# Patient Record
Sex: Female | Born: 1935 | Race: White | Hispanic: No | Smoking: Former smoker
Health system: Southern US, Community
[De-identification: ages and names within clinical notes are randomized; demographics above are authoritative.]

## PROBLEM LIST (undated history)

## (undated) DIAGNOSIS — I482 Chronic atrial fibrillation, unspecified: Secondary | ICD-10-CM

## (undated) DIAGNOSIS — N184 Chronic kidney disease, stage 4 (severe): Secondary | ICD-10-CM

## (undated) DIAGNOSIS — I130 Hypertensive heart and chronic kidney disease with heart failure and stage 1 through stage 4 chronic kidney disease, or unspecified chronic kidney disease: Secondary | ICD-10-CM

## (undated) DIAGNOSIS — Z7901 Long term (current) use of anticoagulants: Secondary | ICD-10-CM

## (undated) DIAGNOSIS — I5032 Chronic diastolic (congestive) heart failure: Secondary | ICD-10-CM

## (undated) HISTORY — DX: Chronic kidney disease, stage 4 (severe): N18.4

## (undated) HISTORY — DX: Hypertensive heart and chronic kidney disease with heart failure and stage 1 through stage 4 chronic kidney disease, or unspecified chronic kidney disease: I13.0

## (undated) HISTORY — DX: Chronic atrial fibrillation, unspecified: I48.20

## (undated) HISTORY — PX: RENAL ARTERY STENT: SHX2321

## (undated) HISTORY — DX: Chronic diastolic (congestive) heart failure: I50.32

## (undated) HISTORY — DX: Long term (current) use of anticoagulants: Z79.01

---

## 2002-05-28 ENCOUNTER — Ambulatory Visit (HOSPITAL_COMMUNITY): Admission: RE | Admit: 2002-05-28 | Discharge: 2002-05-28 | Payer: Self-pay | Admitting: Emergency Medicine

## 2011-07-04 DIAGNOSIS — I5030 Unspecified diastolic (congestive) heart failure: Secondary | ICD-10-CM | POA: Diagnosis not present

## 2011-07-04 DIAGNOSIS — I150 Renovascular hypertension: Secondary | ICD-10-CM | POA: Diagnosis not present

## 2011-07-04 DIAGNOSIS — I4891 Unspecified atrial fibrillation: Secondary | ICD-10-CM | POA: Diagnosis not present

## 2011-07-04 DIAGNOSIS — I10 Essential (primary) hypertension: Secondary | ICD-10-CM | POA: Diagnosis not present

## 2011-07-17 DIAGNOSIS — I10 Essential (primary) hypertension: Secondary | ICD-10-CM | POA: Diagnosis not present

## 2011-07-17 DIAGNOSIS — I4891 Unspecified atrial fibrillation: Secondary | ICD-10-CM | POA: Diagnosis not present

## 2011-07-17 DIAGNOSIS — I509 Heart failure, unspecified: Secondary | ICD-10-CM | POA: Diagnosis not present

## 2011-07-24 DIAGNOSIS — I701 Atherosclerosis of renal artery: Secondary | ICD-10-CM | POA: Diagnosis not present

## 2011-08-06 DIAGNOSIS — I509 Heart failure, unspecified: Secondary | ICD-10-CM | POA: Diagnosis not present

## 2011-08-06 DIAGNOSIS — I4891 Unspecified atrial fibrillation: Secondary | ICD-10-CM | POA: Diagnosis not present

## 2011-08-06 DIAGNOSIS — I10 Essential (primary) hypertension: Secondary | ICD-10-CM | POA: Diagnosis not present

## 2011-08-06 DIAGNOSIS — I5030 Unspecified diastolic (congestive) heart failure: Secondary | ICD-10-CM | POA: Diagnosis not present

## 2011-08-17 DIAGNOSIS — I4891 Unspecified atrial fibrillation: Secondary | ICD-10-CM | POA: Diagnosis not present

## 2011-09-19 DIAGNOSIS — I4891 Unspecified atrial fibrillation: Secondary | ICD-10-CM | POA: Diagnosis not present

## 2011-09-19 DIAGNOSIS — J209 Acute bronchitis, unspecified: Secondary | ICD-10-CM | POA: Diagnosis not present

## 2011-09-19 DIAGNOSIS — K219 Gastro-esophageal reflux disease without esophagitis: Secondary | ICD-10-CM | POA: Diagnosis not present

## 2011-09-21 DIAGNOSIS — I4891 Unspecified atrial fibrillation: Secondary | ICD-10-CM | POA: Diagnosis not present

## 2011-09-24 DIAGNOSIS — I4891 Unspecified atrial fibrillation: Secondary | ICD-10-CM | POA: Diagnosis not present

## 2011-10-25 DIAGNOSIS — I4891 Unspecified atrial fibrillation: Secondary | ICD-10-CM | POA: Diagnosis not present

## 2011-11-27 DIAGNOSIS — I4891 Unspecified atrial fibrillation: Secondary | ICD-10-CM | POA: Diagnosis not present

## 2011-12-04 DIAGNOSIS — E789 Disorder of lipoprotein metabolism, unspecified: Secondary | ICD-10-CM | POA: Diagnosis not present

## 2011-12-04 DIAGNOSIS — I4891 Unspecified atrial fibrillation: Secondary | ICD-10-CM | POA: Diagnosis not present

## 2011-12-04 DIAGNOSIS — E785 Hyperlipidemia, unspecified: Secondary | ICD-10-CM | POA: Diagnosis not present

## 2011-12-04 DIAGNOSIS — I10 Essential (primary) hypertension: Secondary | ICD-10-CM | POA: Diagnosis not present

## 2011-12-10 DIAGNOSIS — Z87891 Personal history of nicotine dependence: Secondary | ICD-10-CM | POA: Diagnosis not present

## 2011-12-10 DIAGNOSIS — J449 Chronic obstructive pulmonary disease, unspecified: Secondary | ICD-10-CM | POA: Diagnosis not present

## 2011-12-10 DIAGNOSIS — J310 Chronic rhinitis: Secondary | ICD-10-CM | POA: Diagnosis not present

## 2011-12-10 DIAGNOSIS — G4761 Periodic limb movement disorder: Secondary | ICD-10-CM | POA: Diagnosis not present

## 2012-01-14 DIAGNOSIS — R404 Transient alteration of awareness: Secondary | ICD-10-CM | POA: Diagnosis not present

## 2012-01-14 DIAGNOSIS — G4761 Periodic limb movement disorder: Secondary | ICD-10-CM | POA: Diagnosis not present

## 2012-01-14 DIAGNOSIS — J449 Chronic obstructive pulmonary disease, unspecified: Secondary | ICD-10-CM | POA: Diagnosis not present

## 2012-01-14 DIAGNOSIS — J310 Chronic rhinitis: Secondary | ICD-10-CM | POA: Diagnosis not present

## 2012-01-21 DIAGNOSIS — Z87891 Personal history of nicotine dependence: Secondary | ICD-10-CM | POA: Diagnosis not present

## 2012-02-01 DIAGNOSIS — N189 Chronic kidney disease, unspecified: Secondary | ICD-10-CM | POA: Diagnosis not present

## 2012-02-01 DIAGNOSIS — R269 Unspecified abnormalities of gait and mobility: Secondary | ICD-10-CM | POA: Diagnosis not present

## 2012-02-01 DIAGNOSIS — I10 Essential (primary) hypertension: Secondary | ICD-10-CM | POA: Diagnosis not present

## 2012-02-01 DIAGNOSIS — I4891 Unspecified atrial fibrillation: Secondary | ICD-10-CM | POA: Diagnosis not present

## 2012-02-01 DIAGNOSIS — R079 Chest pain, unspecified: Secondary | ICD-10-CM | POA: Diagnosis not present

## 2012-02-12 DIAGNOSIS — R935 Abnormal findings on diagnostic imaging of other abdominal regions, including retroperitoneum: Secondary | ICD-10-CM | POA: Diagnosis not present

## 2012-02-20 DIAGNOSIS — R079 Chest pain, unspecified: Secondary | ICD-10-CM | POA: Diagnosis not present

## 2012-02-20 DIAGNOSIS — I2510 Atherosclerotic heart disease of native coronary artery without angina pectoris: Secondary | ICD-10-CM | POA: Diagnosis not present

## 2012-03-03 DIAGNOSIS — I4891 Unspecified atrial fibrillation: Secondary | ICD-10-CM | POA: Diagnosis not present

## 2012-03-10 DIAGNOSIS — I4891 Unspecified atrial fibrillation: Secondary | ICD-10-CM | POA: Diagnosis not present

## 2012-03-14 DIAGNOSIS — Z23 Encounter for immunization: Secondary | ICD-10-CM | POA: Diagnosis not present

## 2012-04-03 DIAGNOSIS — I4891 Unspecified atrial fibrillation: Secondary | ICD-10-CM | POA: Diagnosis not present

## 2012-04-03 DIAGNOSIS — G500 Trigeminal neuralgia: Secondary | ICD-10-CM | POA: Diagnosis not present

## 2012-04-03 DIAGNOSIS — I2510 Atherosclerotic heart disease of native coronary artery without angina pectoris: Secondary | ICD-10-CM | POA: Diagnosis not present

## 2012-04-03 DIAGNOSIS — I10 Essential (primary) hypertension: Secondary | ICD-10-CM | POA: Diagnosis not present

## 2012-04-07 DIAGNOSIS — R42 Dizziness and giddiness: Secondary | ICD-10-CM | POA: Diagnosis not present

## 2012-04-07 DIAGNOSIS — R079 Chest pain, unspecified: Secondary | ICD-10-CM | POA: Diagnosis not present

## 2012-04-07 DIAGNOSIS — K219 Gastro-esophageal reflux disease without esophagitis: Secondary | ICD-10-CM | POA: Diagnosis not present

## 2012-04-07 DIAGNOSIS — R4182 Altered mental status, unspecified: Secondary | ICD-10-CM | POA: Diagnosis not present

## 2012-04-07 DIAGNOSIS — E7800 Pure hypercholesterolemia, unspecified: Secondary | ICD-10-CM | POA: Diagnosis not present

## 2012-04-07 DIAGNOSIS — T50995A Adverse effect of other drugs, medicaments and biological substances, initial encounter: Secondary | ICD-10-CM | POA: Diagnosis not present

## 2012-04-07 DIAGNOSIS — J438 Other emphysema: Secondary | ICD-10-CM | POA: Diagnosis not present

## 2012-04-07 DIAGNOSIS — I10 Essential (primary) hypertension: Secondary | ICD-10-CM | POA: Diagnosis not present

## 2012-04-07 DIAGNOSIS — I4891 Unspecified atrial fibrillation: Secondary | ICD-10-CM | POA: Diagnosis not present

## 2012-04-07 DIAGNOSIS — I509 Heart failure, unspecified: Secondary | ICD-10-CM | POA: Diagnosis not present

## 2012-04-07 DIAGNOSIS — T887XXA Unspecified adverse effect of drug or medicament, initial encounter: Secondary | ICD-10-CM | POA: Diagnosis not present

## 2012-04-08 DIAGNOSIS — J310 Chronic rhinitis: Secondary | ICD-10-CM | POA: Diagnosis not present

## 2012-04-08 DIAGNOSIS — I10 Essential (primary) hypertension: Secondary | ICD-10-CM | POA: Diagnosis not present

## 2012-04-08 DIAGNOSIS — G4761 Periodic limb movement disorder: Secondary | ICD-10-CM | POA: Diagnosis not present

## 2012-04-08 DIAGNOSIS — J449 Chronic obstructive pulmonary disease, unspecified: Secondary | ICD-10-CM | POA: Diagnosis not present

## 2012-04-08 DIAGNOSIS — R079 Chest pain, unspecified: Secondary | ICD-10-CM | POA: Diagnosis not present

## 2012-04-08 DIAGNOSIS — I4891 Unspecified atrial fibrillation: Secondary | ICD-10-CM | POA: Diagnosis not present

## 2012-04-08 DIAGNOSIS — E7800 Pure hypercholesterolemia, unspecified: Secondary | ICD-10-CM | POA: Diagnosis not present

## 2012-04-08 DIAGNOSIS — R4182 Altered mental status, unspecified: Secondary | ICD-10-CM | POA: Diagnosis not present

## 2012-04-08 DIAGNOSIS — R404 Transient alteration of awareness: Secondary | ICD-10-CM | POA: Diagnosis not present

## 2012-04-08 DIAGNOSIS — R42 Dizziness and giddiness: Secondary | ICD-10-CM | POA: Diagnosis not present

## 2012-04-08 DIAGNOSIS — I509 Heart failure, unspecified: Secondary | ICD-10-CM | POA: Diagnosis not present

## 2012-04-08 DIAGNOSIS — K219 Gastro-esophageal reflux disease without esophagitis: Secondary | ICD-10-CM | POA: Diagnosis not present

## 2012-04-09 DIAGNOSIS — I509 Heart failure, unspecified: Secondary | ICD-10-CM | POA: Diagnosis not present

## 2012-04-09 DIAGNOSIS — I2510 Atherosclerotic heart disease of native coronary artery without angina pectoris: Secondary | ICD-10-CM | POA: Diagnosis not present

## 2012-04-09 DIAGNOSIS — I4891 Unspecified atrial fibrillation: Secondary | ICD-10-CM | POA: Diagnosis not present

## 2012-04-09 DIAGNOSIS — N189 Chronic kidney disease, unspecified: Secondary | ICD-10-CM | POA: Diagnosis not present

## 2012-04-23 DIAGNOSIS — E876 Hypokalemia: Secondary | ICD-10-CM | POA: Diagnosis not present

## 2012-04-23 DIAGNOSIS — I5030 Unspecified diastolic (congestive) heart failure: Secondary | ICD-10-CM | POA: Diagnosis not present

## 2012-04-23 DIAGNOSIS — I4891 Unspecified atrial fibrillation: Secondary | ICD-10-CM | POA: Diagnosis not present

## 2012-04-23 DIAGNOSIS — I10 Essential (primary) hypertension: Secondary | ICD-10-CM | POA: Diagnosis not present

## 2012-04-23 DIAGNOSIS — I6529 Occlusion and stenosis of unspecified carotid artery: Secondary | ICD-10-CM | POA: Diagnosis not present

## 2012-05-05 DIAGNOSIS — I4891 Unspecified atrial fibrillation: Secondary | ICD-10-CM | POA: Diagnosis not present

## 2012-05-19 DIAGNOSIS — I4891 Unspecified atrial fibrillation: Secondary | ICD-10-CM | POA: Diagnosis not present

## 2012-06-03 DIAGNOSIS — J029 Acute pharyngitis, unspecified: Secondary | ICD-10-CM | POA: Diagnosis not present

## 2012-06-03 DIAGNOSIS — K219 Gastro-esophageal reflux disease without esophagitis: Secondary | ICD-10-CM | POA: Diagnosis not present

## 2012-06-10 DIAGNOSIS — J438 Other emphysema: Secondary | ICD-10-CM | POA: Diagnosis not present

## 2012-06-10 DIAGNOSIS — I10 Essential (primary) hypertension: Secondary | ICD-10-CM | POA: Diagnosis not present

## 2012-06-10 DIAGNOSIS — J9620 Acute and chronic respiratory failure, unspecified whether with hypoxia or hypercapnia: Secondary | ICD-10-CM | POA: Diagnosis not present

## 2012-06-10 DIAGNOSIS — R079 Chest pain, unspecified: Secondary | ICD-10-CM | POA: Diagnosis not present

## 2012-06-10 DIAGNOSIS — M810 Age-related osteoporosis without current pathological fracture: Secondary | ICD-10-CM | POA: Diagnosis present

## 2012-06-10 DIAGNOSIS — I509 Heart failure, unspecified: Secondary | ICD-10-CM | POA: Diagnosis not present

## 2012-06-10 DIAGNOSIS — Z7901 Long term (current) use of anticoagulants: Secondary | ICD-10-CM | POA: Diagnosis not present

## 2012-06-10 DIAGNOSIS — J9610 Chronic respiratory failure, unspecified whether with hypoxia or hypercapnia: Secondary | ICD-10-CM | POA: Diagnosis not present

## 2012-06-10 DIAGNOSIS — R0902 Hypoxemia: Secondary | ICD-10-CM | POA: Diagnosis not present

## 2012-06-10 DIAGNOSIS — J9600 Acute respiratory failure, unspecified whether with hypoxia or hypercapnia: Secondary | ICD-10-CM | POA: Diagnosis not present

## 2012-06-10 DIAGNOSIS — I252 Old myocardial infarction: Secondary | ICD-10-CM | POA: Diagnosis not present

## 2012-06-10 DIAGNOSIS — K219 Gastro-esophageal reflux disease without esophagitis: Secondary | ICD-10-CM | POA: Diagnosis present

## 2012-06-10 DIAGNOSIS — E785 Hyperlipidemia, unspecified: Secondary | ICD-10-CM | POA: Diagnosis present

## 2012-06-10 DIAGNOSIS — I701 Atherosclerosis of renal artery: Secondary | ICD-10-CM | POA: Diagnosis present

## 2012-06-10 DIAGNOSIS — J4489 Other specified chronic obstructive pulmonary disease: Secondary | ICD-10-CM | POA: Diagnosis not present

## 2012-06-10 DIAGNOSIS — J441 Chronic obstructive pulmonary disease with (acute) exacerbation: Secondary | ICD-10-CM | POA: Diagnosis not present

## 2012-06-10 DIAGNOSIS — I6529 Occlusion and stenosis of unspecified carotid artery: Secondary | ICD-10-CM | POA: Diagnosis present

## 2012-06-10 DIAGNOSIS — R1310 Dysphagia, unspecified: Secondary | ICD-10-CM | POA: Diagnosis not present

## 2012-06-10 DIAGNOSIS — J189 Pneumonia, unspecified organism: Secondary | ICD-10-CM | POA: Diagnosis not present

## 2012-06-10 DIAGNOSIS — Z79899 Other long term (current) drug therapy: Secondary | ICD-10-CM | POA: Diagnosis not present

## 2012-06-10 DIAGNOSIS — I5030 Unspecified diastolic (congestive) heart failure: Secondary | ICD-10-CM | POA: Diagnosis not present

## 2012-06-10 DIAGNOSIS — Z9981 Dependence on supplemental oxygen: Secondary | ICD-10-CM | POA: Diagnosis not present

## 2012-06-10 DIAGNOSIS — Z87891 Personal history of nicotine dependence: Secondary | ICD-10-CM | POA: Diagnosis not present

## 2012-06-10 DIAGNOSIS — Z9861 Coronary angioplasty status: Secondary | ICD-10-CM | POA: Diagnosis not present

## 2012-06-10 DIAGNOSIS — I5033 Acute on chronic diastolic (congestive) heart failure: Secondary | ICD-10-CM | POA: Diagnosis not present

## 2012-06-10 DIAGNOSIS — I501 Left ventricular failure: Secondary | ICD-10-CM | POA: Diagnosis not present

## 2012-06-10 DIAGNOSIS — R0602 Shortness of breath: Secondary | ICD-10-CM | POA: Diagnosis not present

## 2012-06-10 DIAGNOSIS — I5021 Acute systolic (congestive) heart failure: Secondary | ICD-10-CM | POA: Diagnosis not present

## 2012-06-10 DIAGNOSIS — I369 Nonrheumatic tricuspid valve disorder, unspecified: Secondary | ICD-10-CM | POA: Diagnosis not present

## 2012-06-10 DIAGNOSIS — I4891 Unspecified atrial fibrillation: Secondary | ICD-10-CM | POA: Diagnosis not present

## 2012-06-10 DIAGNOSIS — J449 Chronic obstructive pulmonary disease, unspecified: Secondary | ICD-10-CM | POA: Diagnosis not present

## 2012-06-10 DIAGNOSIS — I129 Hypertensive chronic kidney disease with stage 1 through stage 4 chronic kidney disease, or unspecified chronic kidney disease: Secondary | ICD-10-CM | POA: Diagnosis present

## 2012-06-16 DIAGNOSIS — J9620 Acute and chronic respiratory failure, unspecified whether with hypoxia or hypercapnia: Secondary | ICD-10-CM | POA: Diagnosis not present

## 2012-06-16 DIAGNOSIS — J189 Pneumonia, unspecified organism: Secondary | ICD-10-CM | POA: Diagnosis not present

## 2012-06-16 DIAGNOSIS — I509 Heart failure, unspecified: Secondary | ICD-10-CM | POA: Diagnosis not present

## 2012-06-16 DIAGNOSIS — J9600 Acute respiratory failure, unspecified whether with hypoxia or hypercapnia: Secondary | ICD-10-CM | POA: Diagnosis not present

## 2012-06-16 DIAGNOSIS — J449 Chronic obstructive pulmonary disease, unspecified: Secondary | ICD-10-CM | POA: Diagnosis not present

## 2012-06-16 DIAGNOSIS — M810 Age-related osteoporosis without current pathological fracture: Secondary | ICD-10-CM | POA: Diagnosis not present

## 2012-06-16 DIAGNOSIS — J9610 Chronic respiratory failure, unspecified whether with hypoxia or hypercapnia: Secondary | ICD-10-CM | POA: Diagnosis not present

## 2012-06-16 DIAGNOSIS — J438 Other emphysema: Secondary | ICD-10-CM | POA: Diagnosis not present

## 2012-06-16 DIAGNOSIS — I4891 Unspecified atrial fibrillation: Secondary | ICD-10-CM | POA: Diagnosis not present

## 2012-06-16 DIAGNOSIS — E785 Hyperlipidemia, unspecified: Secondary | ICD-10-CM | POA: Diagnosis not present

## 2012-06-16 DIAGNOSIS — R509 Fever, unspecified: Secondary | ICD-10-CM | POA: Diagnosis not present

## 2012-06-16 DIAGNOSIS — I10 Essential (primary) hypertension: Secondary | ICD-10-CM | POA: Diagnosis not present

## 2012-06-17 DIAGNOSIS — J189 Pneumonia, unspecified organism: Secondary | ICD-10-CM | POA: Diagnosis not present

## 2012-06-17 DIAGNOSIS — I10 Essential (primary) hypertension: Secondary | ICD-10-CM | POA: Diagnosis not present

## 2012-06-17 DIAGNOSIS — I4891 Unspecified atrial fibrillation: Secondary | ICD-10-CM | POA: Diagnosis not present

## 2012-06-17 DIAGNOSIS — J9610 Chronic respiratory failure, unspecified whether with hypoxia or hypercapnia: Secondary | ICD-10-CM | POA: Diagnosis not present

## 2012-07-18 DIAGNOSIS — M6281 Muscle weakness (generalized): Secondary | ICD-10-CM | POA: Diagnosis not present

## 2012-07-18 DIAGNOSIS — J441 Chronic obstructive pulmonary disease with (acute) exacerbation: Secondary | ICD-10-CM | POA: Diagnosis not present

## 2012-07-18 DIAGNOSIS — R269 Unspecified abnormalities of gait and mobility: Secondary | ICD-10-CM | POA: Diagnosis not present

## 2012-07-18 DIAGNOSIS — Z5189 Encounter for other specified aftercare: Secondary | ICD-10-CM | POA: Diagnosis not present

## 2012-07-18 DIAGNOSIS — I509 Heart failure, unspecified: Secondary | ICD-10-CM | POA: Diagnosis not present

## 2012-07-18 DIAGNOSIS — I10 Essential (primary) hypertension: Secondary | ICD-10-CM | POA: Diagnosis not present

## 2012-07-21 DIAGNOSIS — R269 Unspecified abnormalities of gait and mobility: Secondary | ICD-10-CM | POA: Diagnosis not present

## 2012-07-21 DIAGNOSIS — M6281 Muscle weakness (generalized): Secondary | ICD-10-CM | POA: Diagnosis not present

## 2012-07-21 DIAGNOSIS — I10 Essential (primary) hypertension: Secondary | ICD-10-CM | POA: Diagnosis not present

## 2012-07-21 DIAGNOSIS — I509 Heart failure, unspecified: Secondary | ICD-10-CM | POA: Diagnosis not present

## 2012-07-21 DIAGNOSIS — J441 Chronic obstructive pulmonary disease with (acute) exacerbation: Secondary | ICD-10-CM | POA: Diagnosis not present

## 2012-07-21 DIAGNOSIS — Z5189 Encounter for other specified aftercare: Secondary | ICD-10-CM | POA: Diagnosis not present

## 2012-07-25 DIAGNOSIS — R269 Unspecified abnormalities of gait and mobility: Secondary | ICD-10-CM | POA: Diagnosis not present

## 2012-07-25 DIAGNOSIS — I10 Essential (primary) hypertension: Secondary | ICD-10-CM | POA: Diagnosis not present

## 2012-07-25 DIAGNOSIS — M6281 Muscle weakness (generalized): Secondary | ICD-10-CM | POA: Diagnosis not present

## 2012-07-25 DIAGNOSIS — I509 Heart failure, unspecified: Secondary | ICD-10-CM | POA: Diagnosis not present

## 2012-07-25 DIAGNOSIS — J441 Chronic obstructive pulmonary disease with (acute) exacerbation: Secondary | ICD-10-CM | POA: Diagnosis not present

## 2012-07-25 DIAGNOSIS — Z5189 Encounter for other specified aftercare: Secondary | ICD-10-CM | POA: Diagnosis not present

## 2012-07-28 DIAGNOSIS — R269 Unspecified abnormalities of gait and mobility: Secondary | ICD-10-CM | POA: Diagnosis not present

## 2012-07-28 DIAGNOSIS — Z5189 Encounter for other specified aftercare: Secondary | ICD-10-CM | POA: Diagnosis not present

## 2012-07-28 DIAGNOSIS — M6281 Muscle weakness (generalized): Secondary | ICD-10-CM | POA: Diagnosis not present

## 2012-07-28 DIAGNOSIS — I509 Heart failure, unspecified: Secondary | ICD-10-CM | POA: Diagnosis not present

## 2012-07-28 DIAGNOSIS — I10 Essential (primary) hypertension: Secondary | ICD-10-CM | POA: Diagnosis not present

## 2012-07-28 DIAGNOSIS — J441 Chronic obstructive pulmonary disease with (acute) exacerbation: Secondary | ICD-10-CM | POA: Diagnosis not present

## 2012-07-29 DIAGNOSIS — I4891 Unspecified atrial fibrillation: Secondary | ICD-10-CM | POA: Diagnosis not present

## 2012-07-29 DIAGNOSIS — J4489 Other specified chronic obstructive pulmonary disease: Secondary | ICD-10-CM | POA: Diagnosis not present

## 2012-07-29 DIAGNOSIS — J449 Chronic obstructive pulmonary disease, unspecified: Secondary | ICD-10-CM | POA: Diagnosis not present

## 2012-07-29 DIAGNOSIS — N189 Chronic kidney disease, unspecified: Secondary | ICD-10-CM | POA: Diagnosis not present

## 2012-07-30 DIAGNOSIS — J441 Chronic obstructive pulmonary disease with (acute) exacerbation: Secondary | ICD-10-CM | POA: Diagnosis not present

## 2012-07-30 DIAGNOSIS — I10 Essential (primary) hypertension: Secondary | ICD-10-CM | POA: Diagnosis not present

## 2012-07-30 DIAGNOSIS — R269 Unspecified abnormalities of gait and mobility: Secondary | ICD-10-CM | POA: Diagnosis not present

## 2012-07-30 DIAGNOSIS — Z5189 Encounter for other specified aftercare: Secondary | ICD-10-CM | POA: Diagnosis not present

## 2012-07-30 DIAGNOSIS — M6281 Muscle weakness (generalized): Secondary | ICD-10-CM | POA: Diagnosis not present

## 2012-07-30 DIAGNOSIS — I509 Heart failure, unspecified: Secondary | ICD-10-CM | POA: Diagnosis not present

## 2012-08-04 DIAGNOSIS — J441 Chronic obstructive pulmonary disease with (acute) exacerbation: Secondary | ICD-10-CM | POA: Diagnosis not present

## 2012-08-04 DIAGNOSIS — I509 Heart failure, unspecified: Secondary | ICD-10-CM | POA: Diagnosis not present

## 2012-08-04 DIAGNOSIS — R269 Unspecified abnormalities of gait and mobility: Secondary | ICD-10-CM | POA: Diagnosis not present

## 2012-08-04 DIAGNOSIS — M6281 Muscle weakness (generalized): Secondary | ICD-10-CM | POA: Diagnosis not present

## 2012-08-04 DIAGNOSIS — Z5189 Encounter for other specified aftercare: Secondary | ICD-10-CM | POA: Diagnosis not present

## 2012-08-04 DIAGNOSIS — I10 Essential (primary) hypertension: Secondary | ICD-10-CM | POA: Diagnosis not present

## 2012-08-06 DIAGNOSIS — Z5189 Encounter for other specified aftercare: Secondary | ICD-10-CM | POA: Diagnosis not present

## 2012-08-06 DIAGNOSIS — I10 Essential (primary) hypertension: Secondary | ICD-10-CM | POA: Diagnosis not present

## 2012-08-06 DIAGNOSIS — I509 Heart failure, unspecified: Secondary | ICD-10-CM | POA: Diagnosis not present

## 2012-08-11 DIAGNOSIS — R269 Unspecified abnormalities of gait and mobility: Secondary | ICD-10-CM | POA: Diagnosis not present

## 2012-08-12 DIAGNOSIS — I4891 Unspecified atrial fibrillation: Secondary | ICD-10-CM | POA: Diagnosis not present

## 2012-08-14 DIAGNOSIS — I509 Heart failure, unspecified: Secondary | ICD-10-CM | POA: Diagnosis not present

## 2012-08-14 DIAGNOSIS — R269 Unspecified abnormalities of gait and mobility: Secondary | ICD-10-CM | POA: Diagnosis not present

## 2012-08-14 DIAGNOSIS — Z5189 Encounter for other specified aftercare: Secondary | ICD-10-CM | POA: Diagnosis not present

## 2012-09-29 DIAGNOSIS — J449 Chronic obstructive pulmonary disease, unspecified: Secondary | ICD-10-CM | POA: Diagnosis not present

## 2012-09-29 DIAGNOSIS — I4891 Unspecified atrial fibrillation: Secondary | ICD-10-CM | POA: Diagnosis not present

## 2012-09-29 DIAGNOSIS — H6120 Impacted cerumen, unspecified ear: Secondary | ICD-10-CM | POA: Diagnosis not present

## 2012-09-29 DIAGNOSIS — I509 Heart failure, unspecified: Secondary | ICD-10-CM | POA: Diagnosis not present

## 2012-11-25 DIAGNOSIS — G4761 Periodic limb movement disorder: Secondary | ICD-10-CM | POA: Diagnosis not present

## 2012-11-25 DIAGNOSIS — R404 Transient alteration of awareness: Secondary | ICD-10-CM | POA: Diagnosis not present

## 2012-11-25 DIAGNOSIS — J449 Chronic obstructive pulmonary disease, unspecified: Secondary | ICD-10-CM | POA: Diagnosis not present

## 2012-11-25 DIAGNOSIS — J310 Chronic rhinitis: Secondary | ICD-10-CM | POA: Diagnosis not present

## 2012-12-02 DIAGNOSIS — I10 Essential (primary) hypertension: Secondary | ICD-10-CM | POA: Diagnosis not present

## 2012-12-02 DIAGNOSIS — G609 Hereditary and idiopathic neuropathy, unspecified: Secondary | ICD-10-CM | POA: Diagnosis not present

## 2012-12-02 DIAGNOSIS — G9009 Other idiopathic peripheral autonomic neuropathy: Secondary | ICD-10-CM | POA: Diagnosis not present

## 2012-12-02 DIAGNOSIS — M549 Dorsalgia, unspecified: Secondary | ICD-10-CM | POA: Diagnosis not present

## 2012-12-02 DIAGNOSIS — J449 Chronic obstructive pulmonary disease, unspecified: Secondary | ICD-10-CM | POA: Diagnosis not present

## 2012-12-15 DIAGNOSIS — J449 Chronic obstructive pulmonary disease, unspecified: Secondary | ICD-10-CM | POA: Diagnosis not present

## 2012-12-15 DIAGNOSIS — K5730 Diverticulosis of large intestine without perforation or abscess without bleeding: Secondary | ICD-10-CM | POA: Diagnosis not present

## 2012-12-15 DIAGNOSIS — Z79899 Other long term (current) drug therapy: Secondary | ICD-10-CM | POA: Diagnosis not present

## 2012-12-15 DIAGNOSIS — M545 Low back pain, unspecified: Secondary | ICD-10-CM | POA: Diagnosis not present

## 2012-12-15 DIAGNOSIS — J4489 Other specified chronic obstructive pulmonary disease: Secondary | ICD-10-CM | POA: Diagnosis not present

## 2012-12-15 DIAGNOSIS — Z9981 Dependence on supplemental oxygen: Secondary | ICD-10-CM | POA: Diagnosis not present

## 2012-12-15 DIAGNOSIS — Z87891 Personal history of nicotine dependence: Secondary | ICD-10-CM | POA: Diagnosis not present

## 2012-12-15 DIAGNOSIS — N390 Urinary tract infection, site not specified: Secondary | ICD-10-CM | POA: Diagnosis not present

## 2012-12-15 DIAGNOSIS — R109 Unspecified abdominal pain: Secondary | ICD-10-CM | POA: Diagnosis not present

## 2012-12-15 DIAGNOSIS — N189 Chronic kidney disease, unspecified: Secondary | ICD-10-CM | POA: Diagnosis not present

## 2012-12-15 DIAGNOSIS — I509 Heart failure, unspecified: Secondary | ICD-10-CM | POA: Diagnosis not present

## 2012-12-15 DIAGNOSIS — Z9861 Coronary angioplasty status: Secondary | ICD-10-CM | POA: Diagnosis not present

## 2012-12-15 DIAGNOSIS — N2889 Other specified disorders of kidney and ureter: Secondary | ICD-10-CM | POA: Diagnosis not present

## 2012-12-15 DIAGNOSIS — I252 Old myocardial infarction: Secondary | ICD-10-CM | POA: Diagnosis not present

## 2012-12-15 DIAGNOSIS — Z91041 Radiographic dye allergy status: Secondary | ICD-10-CM | POA: Diagnosis not present

## 2012-12-17 DIAGNOSIS — J9801 Acute bronchospasm: Secondary | ICD-10-CM | POA: Diagnosis not present

## 2012-12-17 DIAGNOSIS — Z79899 Other long term (current) drug therapy: Secondary | ICD-10-CM | POA: Diagnosis not present

## 2012-12-17 DIAGNOSIS — I509 Heart failure, unspecified: Secondary | ICD-10-CM | POA: Diagnosis not present

## 2012-12-17 DIAGNOSIS — R0602 Shortness of breath: Secondary | ICD-10-CM | POA: Diagnosis not present

## 2012-12-17 DIAGNOSIS — Z9861 Coronary angioplasty status: Secondary | ICD-10-CM | POA: Diagnosis not present

## 2012-12-17 DIAGNOSIS — Z91041 Radiographic dye allergy status: Secondary | ICD-10-CM | POA: Diagnosis not present

## 2012-12-17 DIAGNOSIS — I252 Old myocardial infarction: Secondary | ICD-10-CM | POA: Diagnosis not present

## 2012-12-17 DIAGNOSIS — I4891 Unspecified atrial fibrillation: Secondary | ICD-10-CM | POA: Diagnosis not present

## 2012-12-17 DIAGNOSIS — N189 Chronic kidney disease, unspecified: Secondary | ICD-10-CM | POA: Diagnosis not present

## 2012-12-21 DIAGNOSIS — E7800 Pure hypercholesterolemia, unspecified: Secondary | ICD-10-CM | POA: Diagnosis not present

## 2012-12-21 DIAGNOSIS — K219 Gastro-esophageal reflux disease without esophagitis: Secondary | ICD-10-CM | POA: Diagnosis not present

## 2012-12-21 DIAGNOSIS — I4891 Unspecified atrial fibrillation: Secondary | ICD-10-CM | POA: Diagnosis not present

## 2012-12-21 DIAGNOSIS — I10 Essential (primary) hypertension: Secondary | ICD-10-CM | POA: Diagnosis not present

## 2012-12-21 DIAGNOSIS — I2510 Atherosclerotic heart disease of native coronary artery without angina pectoris: Secondary | ICD-10-CM | POA: Diagnosis not present

## 2012-12-21 DIAGNOSIS — F411 Generalized anxiety disorder: Secondary | ICD-10-CM | POA: Diagnosis not present

## 2012-12-21 DIAGNOSIS — M545 Low back pain: Secondary | ICD-10-CM | POA: Diagnosis not present

## 2012-12-21 DIAGNOSIS — X503XXA Overexertion from repetitive movements, initial encounter: Secondary | ICD-10-CM | POA: Diagnosis not present

## 2012-12-21 DIAGNOSIS — J438 Other emphysema: Secondary | ICD-10-CM | POA: Diagnosis not present

## 2012-12-21 DIAGNOSIS — S339XXA Sprain of unspecified parts of lumbar spine and pelvis, initial encounter: Secondary | ICD-10-CM | POA: Diagnosis not present

## 2012-12-21 DIAGNOSIS — I509 Heart failure, unspecified: Secondary | ICD-10-CM | POA: Diagnosis not present

## 2012-12-22 DIAGNOSIS — R109 Unspecified abdominal pain: Secondary | ICD-10-CM | POA: Diagnosis not present

## 2012-12-22 DIAGNOSIS — M549 Dorsalgia, unspecified: Secondary | ICD-10-CM | POA: Diagnosis not present

## 2012-12-22 DIAGNOSIS — R259 Unspecified abnormal involuntary movements: Secondary | ICD-10-CM | POA: Diagnosis not present

## 2012-12-22 DIAGNOSIS — N390 Urinary tract infection, site not specified: Secondary | ICD-10-CM | POA: Diagnosis not present

## 2012-12-24 DIAGNOSIS — D518 Other vitamin B12 deficiency anemias: Secondary | ICD-10-CM | POA: Diagnosis not present

## 2012-12-25 DIAGNOSIS — D518 Other vitamin B12 deficiency anemias: Secondary | ICD-10-CM | POA: Diagnosis not present

## 2012-12-26 DIAGNOSIS — D518 Other vitamin B12 deficiency anemias: Secondary | ICD-10-CM | POA: Diagnosis not present

## 2012-12-29 DIAGNOSIS — M62838 Other muscle spasm: Secondary | ICD-10-CM | POA: Diagnosis not present

## 2012-12-29 DIAGNOSIS — M549 Dorsalgia, unspecified: Secondary | ICD-10-CM | POA: Diagnosis not present

## 2012-12-29 DIAGNOSIS — N189 Chronic kidney disease, unspecified: Secondary | ICD-10-CM | POA: Diagnosis not present

## 2012-12-29 DIAGNOSIS — R109 Unspecified abdominal pain: Secondary | ICD-10-CM | POA: Diagnosis not present

## 2012-12-30 DIAGNOSIS — D518 Other vitamin B12 deficiency anemias: Secondary | ICD-10-CM | POA: Diagnosis not present

## 2013-01-06 DIAGNOSIS — D518 Other vitamin B12 deficiency anemias: Secondary | ICD-10-CM | POA: Diagnosis not present

## 2013-01-07 DIAGNOSIS — R404 Transient alteration of awareness: Secondary | ICD-10-CM | POA: Diagnosis not present

## 2013-01-07 DIAGNOSIS — J310 Chronic rhinitis: Secondary | ICD-10-CM | POA: Diagnosis not present

## 2013-01-07 DIAGNOSIS — M549 Dorsalgia, unspecified: Secondary | ICD-10-CM | POA: Diagnosis not present

## 2013-01-07 DIAGNOSIS — J449 Chronic obstructive pulmonary disease, unspecified: Secondary | ICD-10-CM | POA: Diagnosis not present

## 2013-01-07 DIAGNOSIS — G4761 Periodic limb movement disorder: Secondary | ICD-10-CM | POA: Diagnosis not present

## 2013-01-13 DIAGNOSIS — N189 Chronic kidney disease, unspecified: Secondary | ICD-10-CM | POA: Diagnosis not present

## 2013-01-14 DIAGNOSIS — D518 Other vitamin B12 deficiency anemias: Secondary | ICD-10-CM | POA: Diagnosis not present

## 2013-01-16 DIAGNOSIS — N390 Urinary tract infection, site not specified: Secondary | ICD-10-CM | POA: Diagnosis not present

## 2013-01-16 DIAGNOSIS — D649 Anemia, unspecified: Secondary | ICD-10-CM | POA: Diagnosis not present

## 2013-01-16 DIAGNOSIS — R300 Dysuria: Secondary | ICD-10-CM | POA: Diagnosis not present

## 2013-01-16 DIAGNOSIS — I10 Essential (primary) hypertension: Secondary | ICD-10-CM | POA: Diagnosis not present

## 2013-01-16 DIAGNOSIS — N184 Chronic kidney disease, stage 4 (severe): Secondary | ICD-10-CM | POA: Diagnosis not present

## 2013-01-16 DIAGNOSIS — I129 Hypertensive chronic kidney disease with stage 1 through stage 4 chronic kidney disease, or unspecified chronic kidney disease: Secondary | ICD-10-CM | POA: Diagnosis not present

## 2013-01-21 DIAGNOSIS — N390 Urinary tract infection, site not specified: Secondary | ICD-10-CM | POA: Diagnosis not present

## 2013-01-21 DIAGNOSIS — D518 Other vitamin B12 deficiency anemias: Secondary | ICD-10-CM | POA: Diagnosis not present

## 2013-01-30 DIAGNOSIS — N184 Chronic kidney disease, stage 4 (severe): Secondary | ICD-10-CM | POA: Diagnosis not present

## 2013-02-11 DIAGNOSIS — N184 Chronic kidney disease, stage 4 (severe): Secondary | ICD-10-CM | POA: Diagnosis not present

## 2013-02-11 DIAGNOSIS — D649 Anemia, unspecified: Secondary | ICD-10-CM | POA: Diagnosis not present

## 2013-02-18 DIAGNOSIS — D649 Anemia, unspecified: Secondary | ICD-10-CM | POA: Diagnosis not present

## 2013-02-18 DIAGNOSIS — N2581 Secondary hyperparathyroidism of renal origin: Secondary | ICD-10-CM | POA: Diagnosis not present

## 2013-02-18 DIAGNOSIS — N184 Chronic kidney disease, stage 4 (severe): Secondary | ICD-10-CM | POA: Diagnosis not present

## 2013-02-18 DIAGNOSIS — I129 Hypertensive chronic kidney disease with stage 1 through stage 4 chronic kidney disease, or unspecified chronic kidney disease: Secondary | ICD-10-CM | POA: Diagnosis not present

## 2013-03-02 DIAGNOSIS — M25559 Pain in unspecified hip: Secondary | ICD-10-CM | POA: Diagnosis not present

## 2013-03-02 DIAGNOSIS — M549 Dorsalgia, unspecified: Secondary | ICD-10-CM | POA: Diagnosis not present

## 2013-03-02 DIAGNOSIS — D518 Other vitamin B12 deficiency anemias: Secondary | ICD-10-CM | POA: Diagnosis not present

## 2013-03-02 DIAGNOSIS — R109 Unspecified abdominal pain: Secondary | ICD-10-CM | POA: Diagnosis not present

## 2013-03-02 DIAGNOSIS — N189 Chronic kidney disease, unspecified: Secondary | ICD-10-CM | POA: Diagnosis not present

## 2013-03-06 DIAGNOSIS — N184 Chronic kidney disease, stage 4 (severe): Secondary | ICD-10-CM | POA: Diagnosis not present

## 2013-03-09 DIAGNOSIS — M25559 Pain in unspecified hip: Secondary | ICD-10-CM | POA: Diagnosis not present

## 2013-03-09 DIAGNOSIS — M169 Osteoarthritis of hip, unspecified: Secondary | ICD-10-CM | POA: Diagnosis not present

## 2013-03-25 DIAGNOSIS — J449 Chronic obstructive pulmonary disease, unspecified: Secondary | ICD-10-CM | POA: Diagnosis not present

## 2013-03-25 DIAGNOSIS — J310 Chronic rhinitis: Secondary | ICD-10-CM | POA: Diagnosis not present

## 2013-03-25 DIAGNOSIS — R404 Transient alteration of awareness: Secondary | ICD-10-CM | POA: Diagnosis not present

## 2013-03-25 DIAGNOSIS — Z23 Encounter for immunization: Secondary | ICD-10-CM | POA: Diagnosis not present

## 2013-03-25 DIAGNOSIS — G4761 Periodic limb movement disorder: Secondary | ICD-10-CM | POA: Diagnosis not present

## 2013-03-27 DIAGNOSIS — I4891 Unspecified atrial fibrillation: Secondary | ICD-10-CM | POA: Diagnosis not present

## 2013-03-27 DIAGNOSIS — I119 Hypertensive heart disease without heart failure: Secondary | ICD-10-CM | POA: Diagnosis not present

## 2013-03-27 DIAGNOSIS — I5030 Unspecified diastolic (congestive) heart failure: Secondary | ICD-10-CM | POA: Diagnosis not present

## 2013-03-27 DIAGNOSIS — N189 Chronic kidney disease, unspecified: Secondary | ICD-10-CM | POA: Diagnosis not present

## 2013-03-31 DIAGNOSIS — E7800 Pure hypercholesterolemia, unspecified: Secondary | ICD-10-CM | POA: Diagnosis not present

## 2013-03-31 DIAGNOSIS — E559 Vitamin D deficiency, unspecified: Secondary | ICD-10-CM | POA: Diagnosis not present

## 2013-03-31 DIAGNOSIS — N390 Urinary tract infection, site not specified: Secondary | ICD-10-CM | POA: Diagnosis not present

## 2013-03-31 DIAGNOSIS — N184 Chronic kidney disease, stage 4 (severe): Secondary | ICD-10-CM | POA: Diagnosis not present

## 2013-03-31 DIAGNOSIS — D518 Other vitamin B12 deficiency anemias: Secondary | ICD-10-CM | POA: Diagnosis not present

## 2013-03-31 DIAGNOSIS — I10 Essential (primary) hypertension: Secondary | ICD-10-CM | POA: Diagnosis not present

## 2013-04-15 DIAGNOSIS — N184 Chronic kidney disease, stage 4 (severe): Secondary | ICD-10-CM | POA: Diagnosis not present

## 2013-04-15 DIAGNOSIS — N2581 Secondary hyperparathyroidism of renal origin: Secondary | ICD-10-CM | POA: Diagnosis not present

## 2013-04-15 DIAGNOSIS — D649 Anemia, unspecified: Secondary | ICD-10-CM | POA: Diagnosis not present

## 2013-04-20 DIAGNOSIS — I129 Hypertensive chronic kidney disease with stage 1 through stage 4 chronic kidney disease, or unspecified chronic kidney disease: Secondary | ICD-10-CM | POA: Diagnosis not present

## 2013-04-20 DIAGNOSIS — N2581 Secondary hyperparathyroidism of renal origin: Secondary | ICD-10-CM | POA: Diagnosis not present

## 2013-04-20 DIAGNOSIS — N189 Chronic kidney disease, unspecified: Secondary | ICD-10-CM | POA: Diagnosis not present

## 2013-04-20 DIAGNOSIS — D631 Anemia in chronic kidney disease: Secondary | ICD-10-CM | POA: Diagnosis not present

## 2013-05-01 DIAGNOSIS — D518 Other vitamin B12 deficiency anemias: Secondary | ICD-10-CM | POA: Diagnosis not present

## 2013-06-01 DIAGNOSIS — D518 Other vitamin B12 deficiency anemias: Secondary | ICD-10-CM | POA: Diagnosis not present

## 2013-06-29 DIAGNOSIS — J449 Chronic obstructive pulmonary disease, unspecified: Secondary | ICD-10-CM | POA: Diagnosis not present

## 2013-06-29 DIAGNOSIS — J310 Chronic rhinitis: Secondary | ICD-10-CM | POA: Diagnosis not present

## 2013-07-06 DIAGNOSIS — L0390 Cellulitis, unspecified: Secondary | ICD-10-CM | POA: Diagnosis not present

## 2013-07-06 DIAGNOSIS — D518 Other vitamin B12 deficiency anemias: Secondary | ICD-10-CM | POA: Diagnosis not present

## 2013-07-06 DIAGNOSIS — I10 Essential (primary) hypertension: Secondary | ICD-10-CM | POA: Diagnosis not present

## 2013-07-06 DIAGNOSIS — E7800 Pure hypercholesterolemia, unspecified: Secondary | ICD-10-CM | POA: Diagnosis not present

## 2013-07-06 DIAGNOSIS — E559 Vitamin D deficiency, unspecified: Secondary | ICD-10-CM | POA: Diagnosis not present

## 2013-07-06 DIAGNOSIS — L0291 Cutaneous abscess, unspecified: Secondary | ICD-10-CM | POA: Diagnosis not present

## 2013-07-06 DIAGNOSIS — N184 Chronic kidney disease, stage 4 (severe): Secondary | ICD-10-CM | POA: Diagnosis not present

## 2013-08-28 DIAGNOSIS — D518 Other vitamin B12 deficiency anemias: Secondary | ICD-10-CM | POA: Diagnosis not present

## 2013-09-25 DIAGNOSIS — N189 Chronic kidney disease, unspecified: Secondary | ICD-10-CM | POA: Diagnosis not present

## 2013-09-25 DIAGNOSIS — N2581 Secondary hyperparathyroidism of renal origin: Secondary | ICD-10-CM | POA: Diagnosis not present

## 2013-09-25 DIAGNOSIS — D631 Anemia in chronic kidney disease: Secondary | ICD-10-CM | POA: Diagnosis not present

## 2013-09-28 DIAGNOSIS — N184 Chronic kidney disease, stage 4 (severe): Secondary | ICD-10-CM | POA: Diagnosis not present

## 2013-09-28 DIAGNOSIS — D631 Anemia in chronic kidney disease: Secondary | ICD-10-CM | POA: Diagnosis not present

## 2013-09-28 DIAGNOSIS — N039 Chronic nephritic syndrome with unspecified morphologic changes: Secondary | ICD-10-CM | POA: Diagnosis not present

## 2013-09-28 DIAGNOSIS — N2581 Secondary hyperparathyroidism of renal origin: Secondary | ICD-10-CM | POA: Diagnosis not present

## 2013-09-28 DIAGNOSIS — I129 Hypertensive chronic kidney disease with stage 1 through stage 4 chronic kidney disease, or unspecified chronic kidney disease: Secondary | ICD-10-CM | POA: Diagnosis not present

## 2013-09-30 DIAGNOSIS — J449 Chronic obstructive pulmonary disease, unspecified: Secondary | ICD-10-CM | POA: Diagnosis not present

## 2013-09-30 DIAGNOSIS — R404 Transient alteration of awareness: Secondary | ICD-10-CM | POA: Diagnosis not present

## 2013-09-30 DIAGNOSIS — J310 Chronic rhinitis: Secondary | ICD-10-CM | POA: Diagnosis not present

## 2013-10-05 DIAGNOSIS — H6120 Impacted cerumen, unspecified ear: Secondary | ICD-10-CM | POA: Diagnosis not present

## 2013-10-05 DIAGNOSIS — N184 Chronic kidney disease, stage 4 (severe): Secondary | ICD-10-CM | POA: Diagnosis not present

## 2013-10-05 DIAGNOSIS — E7800 Pure hypercholesterolemia, unspecified: Secondary | ICD-10-CM | POA: Diagnosis not present

## 2013-10-05 DIAGNOSIS — I10 Essential (primary) hypertension: Secondary | ICD-10-CM | POA: Diagnosis not present

## 2013-10-05 DIAGNOSIS — D518 Other vitamin B12 deficiency anemias: Secondary | ICD-10-CM | POA: Diagnosis not present

## 2013-10-05 DIAGNOSIS — J438 Other emphysema: Secondary | ICD-10-CM | POA: Diagnosis not present

## 2013-10-07 DIAGNOSIS — I5030 Unspecified diastolic (congestive) heart failure: Secondary | ICD-10-CM | POA: Diagnosis not present

## 2013-10-07 DIAGNOSIS — N189 Chronic kidney disease, unspecified: Secondary | ICD-10-CM | POA: Diagnosis not present

## 2013-10-07 DIAGNOSIS — I701 Atherosclerosis of renal artery: Secondary | ICD-10-CM | POA: Diagnosis not present

## 2013-10-07 DIAGNOSIS — Z7901 Long term (current) use of anticoagulants: Secondary | ICD-10-CM | POA: Diagnosis not present

## 2013-10-07 DIAGNOSIS — I509 Heart failure, unspecified: Secondary | ICD-10-CM | POA: Diagnosis not present

## 2013-10-07 DIAGNOSIS — I4891 Unspecified atrial fibrillation: Secondary | ICD-10-CM | POA: Diagnosis not present

## 2013-10-07 DIAGNOSIS — I130 Hypertensive heart and chronic kidney disease with heart failure and stage 1 through stage 4 chronic kidney disease, or unspecified chronic kidney disease: Secondary | ICD-10-CM | POA: Diagnosis not present

## 2013-10-21 DIAGNOSIS — I4891 Unspecified atrial fibrillation: Secondary | ICD-10-CM | POA: Diagnosis not present

## 2013-10-21 DIAGNOSIS — R0602 Shortness of breath: Secondary | ICD-10-CM | POA: Diagnosis not present

## 2013-10-21 DIAGNOSIS — I5030 Unspecified diastolic (congestive) heart failure: Secondary | ICD-10-CM | POA: Diagnosis not present

## 2013-10-26 DIAGNOSIS — L821 Other seborrheic keratosis: Secondary | ICD-10-CM | POA: Diagnosis not present

## 2013-10-26 DIAGNOSIS — L570 Actinic keratosis: Secondary | ICD-10-CM | POA: Diagnosis not present

## 2013-10-26 DIAGNOSIS — C44711 Basal cell carcinoma of skin of unspecified lower limb, including hip: Secondary | ICD-10-CM | POA: Diagnosis not present

## 2013-11-27 DIAGNOSIS — D518 Other vitamin B12 deficiency anemias: Secondary | ICD-10-CM | POA: Diagnosis not present

## 2013-12-03 DIAGNOSIS — R059 Cough, unspecified: Secondary | ICD-10-CM | POA: Diagnosis not present

## 2013-12-03 DIAGNOSIS — R05 Cough: Secondary | ICD-10-CM | POA: Diagnosis not present

## 2013-12-03 DIAGNOSIS — R404 Transient alteration of awareness: Secondary | ICD-10-CM | POA: Diagnosis not present

## 2013-12-03 DIAGNOSIS — J449 Chronic obstructive pulmonary disease, unspecified: Secondary | ICD-10-CM | POA: Diagnosis not present

## 2013-12-03 DIAGNOSIS — J310 Chronic rhinitis: Secondary | ICD-10-CM | POA: Diagnosis not present

## 2013-12-04 DIAGNOSIS — R05 Cough: Secondary | ICD-10-CM | POA: Diagnosis not present

## 2013-12-04 DIAGNOSIS — R059 Cough, unspecified: Secondary | ICD-10-CM | POA: Diagnosis not present

## 2013-12-10 DIAGNOSIS — G4761 Periodic limb movement disorder: Secondary | ICD-10-CM | POA: Diagnosis not present

## 2013-12-10 DIAGNOSIS — J449 Chronic obstructive pulmonary disease, unspecified: Secondary | ICD-10-CM | POA: Diagnosis not present

## 2013-12-10 DIAGNOSIS — J310 Chronic rhinitis: Secondary | ICD-10-CM | POA: Diagnosis not present

## 2013-12-10 DIAGNOSIS — R059 Cough, unspecified: Secondary | ICD-10-CM | POA: Diagnosis not present

## 2013-12-10 DIAGNOSIS — R05 Cough: Secondary | ICD-10-CM | POA: Diagnosis not present

## 2014-01-05 DIAGNOSIS — E7800 Pure hypercholesterolemia, unspecified: Secondary | ICD-10-CM | POA: Diagnosis not present

## 2014-01-05 DIAGNOSIS — D518 Other vitamin B12 deficiency anemias: Secondary | ICD-10-CM | POA: Diagnosis not present

## 2014-01-05 DIAGNOSIS — N184 Chronic kidney disease, stage 4 (severe): Secondary | ICD-10-CM | POA: Diagnosis not present

## 2014-01-05 DIAGNOSIS — I10 Essential (primary) hypertension: Secondary | ICD-10-CM | POA: Diagnosis not present

## 2014-01-05 DIAGNOSIS — E559 Vitamin D deficiency, unspecified: Secondary | ICD-10-CM | POA: Diagnosis not present

## 2014-01-15 DIAGNOSIS — D631 Anemia in chronic kidney disease: Secondary | ICD-10-CM | POA: Diagnosis not present

## 2014-01-15 DIAGNOSIS — N189 Chronic kidney disease, unspecified: Secondary | ICD-10-CM | POA: Diagnosis not present

## 2014-01-15 DIAGNOSIS — N2581 Secondary hyperparathyroidism of renal origin: Secondary | ICD-10-CM | POA: Diagnosis not present

## 2014-01-20 DIAGNOSIS — N2581 Secondary hyperparathyroidism of renal origin: Secondary | ICD-10-CM | POA: Diagnosis not present

## 2014-01-20 DIAGNOSIS — I129 Hypertensive chronic kidney disease with stage 1 through stage 4 chronic kidney disease, or unspecified chronic kidney disease: Secondary | ICD-10-CM | POA: Diagnosis not present

## 2014-01-20 DIAGNOSIS — D631 Anemia in chronic kidney disease: Secondary | ICD-10-CM | POA: Diagnosis not present

## 2014-01-20 DIAGNOSIS — N184 Chronic kidney disease, stage 4 (severe): Secondary | ICD-10-CM | POA: Diagnosis not present

## 2014-02-05 DIAGNOSIS — D518 Other vitamin B12 deficiency anemias: Secondary | ICD-10-CM | POA: Diagnosis not present

## 2014-02-18 DIAGNOSIS — I5032 Chronic diastolic (congestive) heart failure: Secondary | ICD-10-CM | POA: Diagnosis not present

## 2014-02-18 DIAGNOSIS — N189 Chronic kidney disease, unspecified: Secondary | ICD-10-CM | POA: Diagnosis not present

## 2014-02-18 DIAGNOSIS — Z7901 Long term (current) use of anticoagulants: Secondary | ICD-10-CM | POA: Diagnosis not present

## 2014-02-18 DIAGNOSIS — I130 Hypertensive heart and chronic kidney disease with heart failure and stage 1 through stage 4 chronic kidney disease, or unspecified chronic kidney disease: Secondary | ICD-10-CM | POA: Diagnosis not present

## 2014-02-18 DIAGNOSIS — I509 Heart failure, unspecified: Secondary | ICD-10-CM | POA: Diagnosis not present

## 2014-02-18 DIAGNOSIS — I4891 Unspecified atrial fibrillation: Secondary | ICD-10-CM | POA: Diagnosis not present

## 2014-03-15 DIAGNOSIS — J449 Chronic obstructive pulmonary disease, unspecified: Secondary | ICD-10-CM | POA: Diagnosis not present

## 2014-03-15 DIAGNOSIS — R404 Transient alteration of awareness: Secondary | ICD-10-CM | POA: Diagnosis not present

## 2014-03-15 DIAGNOSIS — J310 Chronic rhinitis: Secondary | ICD-10-CM | POA: Diagnosis not present

## 2014-03-15 DIAGNOSIS — D518 Other vitamin B12 deficiency anemias: Secondary | ICD-10-CM | POA: Diagnosis not present

## 2014-04-06 DIAGNOSIS — J310 Chronic rhinitis: Secondary | ICD-10-CM | POA: Diagnosis not present

## 2014-04-08 DIAGNOSIS — H3530 Unspecified macular degeneration: Secondary | ICD-10-CM | POA: Diagnosis not present

## 2014-04-14 DIAGNOSIS — D519 Vitamin B12 deficiency anemia, unspecified: Secondary | ICD-10-CM | POA: Diagnosis not present

## 2014-04-14 DIAGNOSIS — E780 Pure hypercholesterolemia: Secondary | ICD-10-CM | POA: Diagnosis not present

## 2014-04-14 DIAGNOSIS — I10 Essential (primary) hypertension: Secondary | ICD-10-CM | POA: Diagnosis not present

## 2014-04-14 DIAGNOSIS — N184 Chronic kidney disease, stage 4 (severe): Secondary | ICD-10-CM | POA: Diagnosis not present

## 2014-04-14 DIAGNOSIS — Z23 Encounter for immunization: Secondary | ICD-10-CM | POA: Diagnosis not present

## 2014-04-14 DIAGNOSIS — E559 Vitamin D deficiency, unspecified: Secondary | ICD-10-CM | POA: Diagnosis not present

## 2014-05-06 DIAGNOSIS — H34832 Tributary (branch) retinal vein occlusion, left eye: Secondary | ICD-10-CM | POA: Diagnosis not present

## 2014-05-06 DIAGNOSIS — H3531 Nonexudative age-related macular degeneration: Secondary | ICD-10-CM | POA: Diagnosis not present

## 2014-05-26 DIAGNOSIS — N189 Chronic kidney disease, unspecified: Secondary | ICD-10-CM | POA: Diagnosis not present

## 2014-05-26 DIAGNOSIS — N2581 Secondary hyperparathyroidism of renal origin: Secondary | ICD-10-CM | POA: Diagnosis not present

## 2014-05-27 DIAGNOSIS — N2581 Secondary hyperparathyroidism of renal origin: Secondary | ICD-10-CM | POA: Diagnosis not present

## 2014-05-27 DIAGNOSIS — D631 Anemia in chronic kidney disease: Secondary | ICD-10-CM | POA: Diagnosis not present

## 2014-05-27 DIAGNOSIS — I129 Hypertensive chronic kidney disease with stage 1 through stage 4 chronic kidney disease, or unspecified chronic kidney disease: Secondary | ICD-10-CM | POA: Diagnosis not present

## 2014-05-27 DIAGNOSIS — N184 Chronic kidney disease, stage 4 (severe): Secondary | ICD-10-CM | POA: Diagnosis not present

## 2014-05-27 DIAGNOSIS — R102 Pelvic and perineal pain: Secondary | ICD-10-CM | POA: Diagnosis not present

## 2014-06-08 DIAGNOSIS — N189 Chronic kidney disease, unspecified: Secondary | ICD-10-CM | POA: Diagnosis not present

## 2014-07-14 DIAGNOSIS — E780 Pure hypercholesterolemia: Secondary | ICD-10-CM | POA: Diagnosis not present

## 2014-07-14 DIAGNOSIS — I10 Essential (primary) hypertension: Secondary | ICD-10-CM | POA: Diagnosis not present

## 2014-07-14 DIAGNOSIS — D519 Vitamin B12 deficiency anemia, unspecified: Secondary | ICD-10-CM | POA: Diagnosis not present

## 2014-07-14 DIAGNOSIS — N184 Chronic kidney disease, stage 4 (severe): Secondary | ICD-10-CM | POA: Diagnosis not present

## 2014-07-14 DIAGNOSIS — E559 Vitamin D deficiency, unspecified: Secondary | ICD-10-CM | POA: Diagnosis not present

## 2014-07-15 DIAGNOSIS — I130 Hypertensive heart and chronic kidney disease with heart failure and stage 1 through stage 4 chronic kidney disease, or unspecified chronic kidney disease: Secondary | ICD-10-CM | POA: Diagnosis not present

## 2014-07-15 DIAGNOSIS — I482 Chronic atrial fibrillation: Secondary | ICD-10-CM | POA: Diagnosis not present

## 2014-07-15 DIAGNOSIS — Z7901 Long term (current) use of anticoagulants: Secondary | ICD-10-CM | POA: Diagnosis not present

## 2014-07-15 DIAGNOSIS — I5032 Chronic diastolic (congestive) heart failure: Secondary | ICD-10-CM | POA: Diagnosis not present

## 2014-07-15 DIAGNOSIS — N184 Chronic kidney disease, stage 4 (severe): Secondary | ICD-10-CM | POA: Diagnosis not present

## 2014-07-19 DIAGNOSIS — M549 Dorsalgia, unspecified: Secondary | ICD-10-CM | POA: Diagnosis not present

## 2014-07-19 DIAGNOSIS — M719 Bursopathy, unspecified: Secondary | ICD-10-CM | POA: Diagnosis not present

## 2014-07-19 DIAGNOSIS — M25559 Pain in unspecified hip: Secondary | ICD-10-CM | POA: Diagnosis not present

## 2014-07-22 DIAGNOSIS — H34832 Tributary (branch) retinal vein occlusion, left eye: Secondary | ICD-10-CM | POA: Diagnosis not present

## 2014-07-30 DIAGNOSIS — M25552 Pain in left hip: Secondary | ICD-10-CM | POA: Diagnosis not present

## 2014-07-30 DIAGNOSIS — M1990 Unspecified osteoarthritis, unspecified site: Secondary | ICD-10-CM | POA: Diagnosis not present

## 2014-07-30 DIAGNOSIS — I4891 Unspecified atrial fibrillation: Secondary | ICD-10-CM | POA: Diagnosis not present

## 2014-07-30 DIAGNOSIS — M549 Dorsalgia, unspecified: Secondary | ICD-10-CM | POA: Diagnosis not present

## 2014-07-30 DIAGNOSIS — E780 Pure hypercholesterolemia: Secondary | ICD-10-CM | POA: Diagnosis not present

## 2014-07-30 DIAGNOSIS — M545 Low back pain: Secondary | ICD-10-CM | POA: Diagnosis not present

## 2014-07-30 DIAGNOSIS — M25551 Pain in right hip: Secondary | ICD-10-CM | POA: Diagnosis not present

## 2014-07-30 DIAGNOSIS — S39012A Strain of muscle, fascia and tendon of lower back, initial encounter: Secondary | ICD-10-CM | POA: Diagnosis not present

## 2014-07-30 DIAGNOSIS — M160 Bilateral primary osteoarthritis of hip: Secondary | ICD-10-CM | POA: Diagnosis not present

## 2014-07-30 DIAGNOSIS — I10 Essential (primary) hypertension: Secondary | ICD-10-CM | POA: Diagnosis not present

## 2014-07-30 DIAGNOSIS — T148 Other injury of unspecified body region: Secondary | ICD-10-CM | POA: Diagnosis not present

## 2014-08-04 DIAGNOSIS — M5416 Radiculopathy, lumbar region: Secondary | ICD-10-CM | POA: Diagnosis not present

## 2014-08-04 DIAGNOSIS — M160 Bilateral primary osteoarthritis of hip: Secondary | ICD-10-CM | POA: Diagnosis not present

## 2014-08-17 DIAGNOSIS — M545 Low back pain: Secondary | ICD-10-CM | POA: Diagnosis not present

## 2014-09-03 DIAGNOSIS — M549 Dorsalgia, unspecified: Secondary | ICD-10-CM | POA: Diagnosis not present

## 2014-09-03 DIAGNOSIS — Z79899 Other long term (current) drug therapy: Secondary | ICD-10-CM | POA: Diagnosis not present

## 2014-09-03 DIAGNOSIS — M5416 Radiculopathy, lumbar region: Secondary | ICD-10-CM | POA: Diagnosis not present

## 2014-09-03 DIAGNOSIS — D519 Vitamin B12 deficiency anemia, unspecified: Secondary | ICD-10-CM | POA: Diagnosis not present

## 2014-09-03 DIAGNOSIS — T148 Other injury of unspecified body region: Secondary | ICD-10-CM | POA: Diagnosis not present

## 2014-09-06 ENCOUNTER — Other Ambulatory Visit: Payer: Self-pay | Admitting: Internal Medicine

## 2014-09-06 DIAGNOSIS — R5381 Other malaise: Secondary | ICD-10-CM

## 2014-09-07 DIAGNOSIS — M545 Low back pain: Secondary | ICD-10-CM | POA: Diagnosis not present

## 2014-09-07 DIAGNOSIS — M25551 Pain in right hip: Secondary | ICD-10-CM | POA: Diagnosis not present

## 2014-09-07 DIAGNOSIS — T148 Other injury of unspecified body region: Secondary | ICD-10-CM | POA: Diagnosis not present

## 2014-09-15 ENCOUNTER — Other Ambulatory Visit: Payer: Self-pay | Admitting: Orthopedic Surgery

## 2014-09-15 ENCOUNTER — Other Ambulatory Visit: Payer: Self-pay | Admitting: Internal Medicine

## 2014-09-15 DIAGNOSIS — M5440 Lumbago with sciatica, unspecified side: Secondary | ICD-10-CM

## 2014-09-21 DIAGNOSIS — I10 Essential (primary) hypertension: Secondary | ICD-10-CM | POA: Diagnosis not present

## 2014-09-21 DIAGNOSIS — N2581 Secondary hyperparathyroidism of renal origin: Secondary | ICD-10-CM | POA: Diagnosis not present

## 2014-09-21 DIAGNOSIS — N189 Chronic kidney disease, unspecified: Secondary | ICD-10-CM | POA: Diagnosis not present

## 2014-09-22 DIAGNOSIS — D631 Anemia in chronic kidney disease: Secondary | ICD-10-CM | POA: Diagnosis not present

## 2014-09-22 DIAGNOSIS — N2581 Secondary hyperparathyroidism of renal origin: Secondary | ICD-10-CM | POA: Diagnosis not present

## 2014-09-22 DIAGNOSIS — I129 Hypertensive chronic kidney disease with stage 1 through stage 4 chronic kidney disease, or unspecified chronic kidney disease: Secondary | ICD-10-CM | POA: Diagnosis not present

## 2014-09-22 DIAGNOSIS — N184 Chronic kidney disease, stage 4 (severe): Secondary | ICD-10-CM | POA: Diagnosis not present

## 2014-09-28 DIAGNOSIS — T148 Other injury of unspecified body region: Secondary | ICD-10-CM | POA: Diagnosis not present

## 2014-09-28 DIAGNOSIS — Z79899 Other long term (current) drug therapy: Secondary | ICD-10-CM | POA: Diagnosis not present

## 2014-09-28 DIAGNOSIS — M545 Low back pain: Secondary | ICD-10-CM | POA: Diagnosis not present

## 2014-09-28 DIAGNOSIS — M7989 Other specified soft tissue disorders: Secondary | ICD-10-CM | POA: Diagnosis not present

## 2014-09-28 DIAGNOSIS — M25551 Pain in right hip: Secondary | ICD-10-CM | POA: Diagnosis not present

## 2014-09-28 DIAGNOSIS — R1030 Lower abdominal pain, unspecified: Secondary | ICD-10-CM | POA: Diagnosis not present

## 2014-09-28 DIAGNOSIS — I10 Essential (primary) hypertension: Secondary | ICD-10-CM | POA: Diagnosis not present

## 2014-10-07 ENCOUNTER — Ambulatory Visit
Admission: RE | Admit: 2014-10-07 | Discharge: 2014-10-07 | Disposition: A | Discharge status: Home / Self Care | Payer: Medicare Other | Source: Ambulatory Visit | Attending: Orthopedic Surgery | Admitting: Orthopedic Surgery

## 2014-10-07 DIAGNOSIS — M5440 Lumbago with sciatica, unspecified side: Secondary | ICD-10-CM

## 2014-10-07 DIAGNOSIS — M5136 Other intervertebral disc degeneration, lumbar region: Secondary | ICD-10-CM | POA: Diagnosis not present

## 2014-10-07 DIAGNOSIS — M4856XA Collapsed vertebra, not elsewhere classified, lumbar region, initial encounter for fracture: Secondary | ICD-10-CM | POA: Diagnosis not present

## 2014-10-07 DIAGNOSIS — M47817 Spondylosis without myelopathy or radiculopathy, lumbosacral region: Secondary | ICD-10-CM | POA: Diagnosis not present

## 2014-10-12 DIAGNOSIS — K5730 Diverticulosis of large intestine without perforation or abscess without bleeding: Secondary | ICD-10-CM | POA: Diagnosis not present

## 2014-10-12 DIAGNOSIS — R102 Pelvic and perineal pain: Secondary | ICD-10-CM | POA: Diagnosis not present

## 2014-10-12 DIAGNOSIS — I2510 Atherosclerotic heart disease of native coronary artery without angina pectoris: Secondary | ICD-10-CM | POA: Diagnosis not present

## 2014-10-12 DIAGNOSIS — D519 Vitamin B12 deficiency anemia, unspecified: Secondary | ICD-10-CM | POA: Diagnosis not present

## 2014-10-12 DIAGNOSIS — R1030 Lower abdominal pain, unspecified: Secondary | ICD-10-CM | POA: Diagnosis not present

## 2014-10-14 DIAGNOSIS — R0609 Other forms of dyspnea: Secondary | ICD-10-CM | POA: Diagnosis not present

## 2014-10-14 DIAGNOSIS — R0601 Orthopnea: Secondary | ICD-10-CM | POA: Diagnosis not present

## 2014-10-14 DIAGNOSIS — I509 Heart failure, unspecified: Secondary | ICD-10-CM | POA: Diagnosis not present

## 2014-10-18 DIAGNOSIS — I509 Heart failure, unspecified: Secondary | ICD-10-CM | POA: Diagnosis not present

## 2014-10-18 DIAGNOSIS — Z9981 Dependence on supplemental oxygen: Secondary | ICD-10-CM | POA: Diagnosis not present

## 2014-10-18 DIAGNOSIS — J441 Chronic obstructive pulmonary disease with (acute) exacerbation: Secondary | ICD-10-CM | POA: Diagnosis not present

## 2014-10-18 DIAGNOSIS — R52 Pain, unspecified: Secondary | ICD-10-CM | POA: Diagnosis not present

## 2014-10-20 DIAGNOSIS — I509 Heart failure, unspecified: Secondary | ICD-10-CM | POA: Diagnosis not present

## 2014-10-20 DIAGNOSIS — R52 Pain, unspecified: Secondary | ICD-10-CM | POA: Diagnosis not present

## 2014-10-20 DIAGNOSIS — Z9981 Dependence on supplemental oxygen: Secondary | ICD-10-CM | POA: Diagnosis not present

## 2014-10-20 DIAGNOSIS — J441 Chronic obstructive pulmonary disease with (acute) exacerbation: Secondary | ICD-10-CM | POA: Diagnosis not present

## 2014-10-22 DIAGNOSIS — Z9981 Dependence on supplemental oxygen: Secondary | ICD-10-CM | POA: Diagnosis not present

## 2014-10-22 DIAGNOSIS — I509 Heart failure, unspecified: Secondary | ICD-10-CM | POA: Diagnosis not present

## 2014-10-22 DIAGNOSIS — J441 Chronic obstructive pulmonary disease with (acute) exacerbation: Secondary | ICD-10-CM | POA: Diagnosis not present

## 2014-10-22 DIAGNOSIS — R52 Pain, unspecified: Secondary | ICD-10-CM | POA: Diagnosis not present

## 2014-10-26 DIAGNOSIS — Z9981 Dependence on supplemental oxygen: Secondary | ICD-10-CM | POA: Diagnosis not present

## 2014-10-26 DIAGNOSIS — J441 Chronic obstructive pulmonary disease with (acute) exacerbation: Secondary | ICD-10-CM | POA: Diagnosis not present

## 2014-10-26 DIAGNOSIS — I509 Heart failure, unspecified: Secondary | ICD-10-CM | POA: Diagnosis not present

## 2014-10-26 DIAGNOSIS — R52 Pain, unspecified: Secondary | ICD-10-CM | POA: Diagnosis not present

## 2014-10-28 DIAGNOSIS — I10 Essential (primary) hypertension: Secondary | ICD-10-CM | POA: Diagnosis not present

## 2014-10-28 DIAGNOSIS — M810 Age-related osteoporosis without current pathological fracture: Secondary | ICD-10-CM | POA: Diagnosis not present

## 2014-10-28 DIAGNOSIS — R0982 Postnasal drip: Secondary | ICD-10-CM | POA: Diagnosis not present

## 2014-10-28 DIAGNOSIS — I509 Heart failure, unspecified: Secondary | ICD-10-CM | POA: Diagnosis not present

## 2014-10-28 DIAGNOSIS — E785 Hyperlipidemia, unspecified: Secondary | ICD-10-CM | POA: Diagnosis not present

## 2014-10-28 DIAGNOSIS — J449 Chronic obstructive pulmonary disease, unspecified: Secondary | ICD-10-CM | POA: Diagnosis not present

## 2014-10-28 DIAGNOSIS — I4891 Unspecified atrial fibrillation: Secondary | ICD-10-CM | POA: Diagnosis not present

## 2014-10-28 DIAGNOSIS — Z79899 Other long term (current) drug therapy: Secondary | ICD-10-CM | POA: Diagnosis not present

## 2014-10-28 DIAGNOSIS — R0609 Other forms of dyspnea: Secondary | ICD-10-CM | POA: Diagnosis not present

## 2014-10-28 DIAGNOSIS — N189 Chronic kidney disease, unspecified: Secondary | ICD-10-CM | POA: Diagnosis not present

## 2014-10-28 DIAGNOSIS — R0601 Orthopnea: Secondary | ICD-10-CM | POA: Diagnosis not present

## 2014-10-29 DIAGNOSIS — R52 Pain, unspecified: Secondary | ICD-10-CM | POA: Diagnosis not present

## 2014-10-29 DIAGNOSIS — J441 Chronic obstructive pulmonary disease with (acute) exacerbation: Secondary | ICD-10-CM | POA: Diagnosis not present

## 2014-10-29 DIAGNOSIS — Z9981 Dependence on supplemental oxygen: Secondary | ICD-10-CM | POA: Diagnosis not present

## 2014-10-29 DIAGNOSIS — I509 Heart failure, unspecified: Secondary | ICD-10-CM | POA: Diagnosis not present

## 2014-11-02 DIAGNOSIS — S32000A Wedge compression fracture of unspecified lumbar vertebra, initial encounter for closed fracture: Secondary | ICD-10-CM | POA: Diagnosis not present

## 2014-11-04 DIAGNOSIS — R52 Pain, unspecified: Secondary | ICD-10-CM | POA: Diagnosis not present

## 2014-11-04 DIAGNOSIS — Z9981 Dependence on supplemental oxygen: Secondary | ICD-10-CM | POA: Diagnosis not present

## 2014-11-04 DIAGNOSIS — J441 Chronic obstructive pulmonary disease with (acute) exacerbation: Secondary | ICD-10-CM | POA: Diagnosis not present

## 2014-11-04 DIAGNOSIS — I509 Heart failure, unspecified: Secondary | ICD-10-CM | POA: Diagnosis not present

## 2014-11-05 DIAGNOSIS — I482 Chronic atrial fibrillation: Secondary | ICD-10-CM | POA: Diagnosis not present

## 2014-11-05 DIAGNOSIS — Z7901 Long term (current) use of anticoagulants: Secondary | ICD-10-CM | POA: Diagnosis not present

## 2014-11-05 DIAGNOSIS — I5032 Chronic diastolic (congestive) heart failure: Secondary | ICD-10-CM | POA: Diagnosis not present

## 2014-11-08 DIAGNOSIS — H3531 Nonexudative age-related macular degeneration: Secondary | ICD-10-CM | POA: Diagnosis not present

## 2014-11-11 DIAGNOSIS — J449 Chronic obstructive pulmonary disease, unspecified: Secondary | ICD-10-CM | POA: Diagnosis not present

## 2014-11-11 DIAGNOSIS — J310 Chronic rhinitis: Secondary | ICD-10-CM | POA: Diagnosis not present

## 2014-11-11 DIAGNOSIS — R05 Cough: Secondary | ICD-10-CM | POA: Diagnosis not present

## 2014-11-11 DIAGNOSIS — R400 Somnolence: Secondary | ICD-10-CM | POA: Diagnosis not present

## 2014-11-22 DIAGNOSIS — I252 Old myocardial infarction: Secondary | ICD-10-CM | POA: Diagnosis not present

## 2014-11-22 DIAGNOSIS — I482 Chronic atrial fibrillation: Secondary | ICD-10-CM | POA: Diagnosis not present

## 2014-11-22 DIAGNOSIS — I4891 Unspecified atrial fibrillation: Secondary | ICD-10-CM | POA: Diagnosis not present

## 2014-11-22 DIAGNOSIS — S32030A Wedge compression fracture of third lumbar vertebra, initial encounter for closed fracture: Secondary | ICD-10-CM | POA: Diagnosis not present

## 2014-11-22 DIAGNOSIS — E780 Pure hypercholesterolemia: Secondary | ICD-10-CM | POA: Diagnosis not present

## 2014-11-22 DIAGNOSIS — M4856XA Collapsed vertebra, not elsewhere classified, lumbar region, initial encounter for fracture: Secondary | ICD-10-CM | POA: Diagnosis not present

## 2014-11-22 DIAGNOSIS — Z86718 Personal history of other venous thrombosis and embolism: Secondary | ICD-10-CM | POA: Diagnosis not present

## 2014-11-22 DIAGNOSIS — Z7901 Long term (current) use of anticoagulants: Secondary | ICD-10-CM | POA: Diagnosis not present

## 2014-11-22 DIAGNOSIS — I519 Heart disease, unspecified: Secondary | ICD-10-CM | POA: Diagnosis not present

## 2014-11-22 DIAGNOSIS — I129 Hypertensive chronic kidney disease with stage 1 through stage 4 chronic kidney disease, or unspecified chronic kidney disease: Secondary | ICD-10-CM | POA: Diagnosis not present

## 2014-11-22 DIAGNOSIS — S32040A Wedge compression fracture of fourth lumbar vertebra, initial encounter for closed fracture: Secondary | ICD-10-CM | POA: Diagnosis not present

## 2014-11-22 DIAGNOSIS — Z79899 Other long term (current) drug therapy: Secondary | ICD-10-CM | POA: Diagnosis not present

## 2014-11-22 DIAGNOSIS — S32038A Other fracture of third lumbar vertebra, initial encounter for closed fracture: Secondary | ICD-10-CM | POA: Diagnosis not present

## 2014-11-22 DIAGNOSIS — S32000A Wedge compression fracture of unspecified lumbar vertebra, initial encounter for closed fracture: Secondary | ICD-10-CM | POA: Diagnosis not present

## 2014-11-22 DIAGNOSIS — S32048A Other fracture of fourth lumbar vertebra, initial encounter for closed fracture: Secondary | ICD-10-CM | POA: Diagnosis not present

## 2014-11-22 DIAGNOSIS — I6529 Occlusion and stenosis of unspecified carotid artery: Secondary | ICD-10-CM | POA: Diagnosis not present

## 2014-11-22 DIAGNOSIS — Z7982 Long term (current) use of aspirin: Secondary | ICD-10-CM | POA: Diagnosis not present

## 2014-11-22 DIAGNOSIS — N184 Chronic kidney disease, stage 4 (severe): Secondary | ICD-10-CM | POA: Diagnosis not present

## 2014-11-22 DIAGNOSIS — I739 Peripheral vascular disease, unspecified: Secondary | ICD-10-CM | POA: Diagnosis not present

## 2014-11-22 DIAGNOSIS — Z87891 Personal history of nicotine dependence: Secondary | ICD-10-CM | POA: Diagnosis not present

## 2014-12-04 DIAGNOSIS — M7989 Other specified soft tissue disorders: Secondary | ICD-10-CM | POA: Diagnosis not present

## 2014-12-04 DIAGNOSIS — I509 Heart failure, unspecified: Secondary | ICD-10-CM | POA: Diagnosis not present

## 2014-12-04 DIAGNOSIS — R0602 Shortness of breath: Secondary | ICD-10-CM | POA: Diagnosis not present

## 2014-12-04 DIAGNOSIS — M85861 Other specified disorders of bone density and structure, right lower leg: Secondary | ICD-10-CM | POA: Diagnosis not present

## 2014-12-04 DIAGNOSIS — R609 Edema, unspecified: Secondary | ICD-10-CM | POA: Diagnosis not present

## 2014-12-07 DIAGNOSIS — M5416 Radiculopathy, lumbar region: Secondary | ICD-10-CM | POA: Diagnosis not present

## 2014-12-07 DIAGNOSIS — M549 Dorsalgia, unspecified: Secondary | ICD-10-CM | POA: Diagnosis not present

## 2014-12-07 DIAGNOSIS — D519 Vitamin B12 deficiency anemia, unspecified: Secondary | ICD-10-CM | POA: Diagnosis not present

## 2014-12-07 DIAGNOSIS — I4891 Unspecified atrial fibrillation: Secondary | ICD-10-CM | POA: Diagnosis not present

## 2014-12-07 DIAGNOSIS — M7989 Other specified soft tissue disorders: Secondary | ICD-10-CM | POA: Diagnosis not present

## 2015-01-05 DIAGNOSIS — Z79899 Other long term (current) drug therapy: Secondary | ICD-10-CM | POA: Diagnosis not present

## 2015-01-05 DIAGNOSIS — D519 Vitamin B12 deficiency anemia, unspecified: Secondary | ICD-10-CM | POA: Diagnosis not present

## 2015-01-05 DIAGNOSIS — T148 Other injury of unspecified body region: Secondary | ICD-10-CM | POA: Diagnosis not present

## 2015-01-05 DIAGNOSIS — N184 Chronic kidney disease, stage 4 (severe): Secondary | ICD-10-CM | POA: Diagnosis not present

## 2015-01-05 DIAGNOSIS — R609 Edema, unspecified: Secondary | ICD-10-CM | POA: Diagnosis not present

## 2015-01-05 DIAGNOSIS — M5416 Radiculopathy, lumbar region: Secondary | ICD-10-CM | POA: Diagnosis not present

## 2015-01-05 DIAGNOSIS — M545 Low back pain: Secondary | ICD-10-CM | POA: Diagnosis not present

## 2015-01-05 DIAGNOSIS — R109 Unspecified abdominal pain: Secondary | ICD-10-CM | POA: Diagnosis not present

## 2015-01-11 DIAGNOSIS — R1084 Generalized abdominal pain: Secondary | ICD-10-CM | POA: Diagnosis not present

## 2015-01-11 DIAGNOSIS — N281 Cyst of kidney, acquired: Secondary | ICD-10-CM | POA: Diagnosis not present

## 2015-01-11 DIAGNOSIS — N261 Atrophy of kidney (terminal): Secondary | ICD-10-CM | POA: Diagnosis not present

## 2015-01-19 DIAGNOSIS — K219 Gastro-esophageal reflux disease without esophagitis: Secondary | ICD-10-CM | POA: Diagnosis not present

## 2015-01-19 DIAGNOSIS — N2581 Secondary hyperparathyroidism of renal origin: Secondary | ICD-10-CM | POA: Diagnosis not present

## 2015-01-19 DIAGNOSIS — K591 Functional diarrhea: Secondary | ICD-10-CM | POA: Diagnosis not present

## 2015-01-19 DIAGNOSIS — R1013 Epigastric pain: Secondary | ICD-10-CM | POA: Diagnosis not present

## 2015-01-19 DIAGNOSIS — K5730 Diverticulosis of large intestine without perforation or abscess without bleeding: Secondary | ICD-10-CM | POA: Diagnosis not present

## 2015-01-19 DIAGNOSIS — N189 Chronic kidney disease, unspecified: Secondary | ICD-10-CM | POA: Diagnosis not present

## 2015-01-24 DIAGNOSIS — D631 Anemia in chronic kidney disease: Secondary | ICD-10-CM | POA: Diagnosis not present

## 2015-01-24 DIAGNOSIS — N184 Chronic kidney disease, stage 4 (severe): Secondary | ICD-10-CM | POA: Diagnosis not present

## 2015-01-24 DIAGNOSIS — I129 Hypertensive chronic kidney disease with stage 1 through stage 4 chronic kidney disease, or unspecified chronic kidney disease: Secondary | ICD-10-CM | POA: Diagnosis not present

## 2015-01-24 DIAGNOSIS — N2581 Secondary hyperparathyroidism of renal origin: Secondary | ICD-10-CM | POA: Diagnosis not present

## 2015-02-02 DIAGNOSIS — D519 Vitamin B12 deficiency anemia, unspecified: Secondary | ICD-10-CM | POA: Diagnosis not present

## 2015-02-02 DIAGNOSIS — Z79899 Other long term (current) drug therapy: Secondary | ICD-10-CM | POA: Diagnosis not present

## 2015-02-02 DIAGNOSIS — N184 Chronic kidney disease, stage 4 (severe): Secondary | ICD-10-CM | POA: Diagnosis not present

## 2015-02-02 DIAGNOSIS — T148 Other injury of unspecified body region: Secondary | ICD-10-CM | POA: Diagnosis not present

## 2015-02-02 DIAGNOSIS — R109 Unspecified abdominal pain: Secondary | ICD-10-CM | POA: Diagnosis not present

## 2015-02-02 DIAGNOSIS — R609 Edema, unspecified: Secondary | ICD-10-CM | POA: Diagnosis not present

## 2015-02-02 DIAGNOSIS — I4891 Unspecified atrial fibrillation: Secondary | ICD-10-CM | POA: Diagnosis not present

## 2015-02-02 DIAGNOSIS — M5416 Radiculopathy, lumbar region: Secondary | ICD-10-CM | POA: Diagnosis not present

## 2015-02-09 DIAGNOSIS — J449 Chronic obstructive pulmonary disease, unspecified: Secondary | ICD-10-CM | POA: Diagnosis not present

## 2015-02-09 DIAGNOSIS — R400 Somnolence: Secondary | ICD-10-CM | POA: Diagnosis not present

## 2015-02-09 DIAGNOSIS — G4733 Obstructive sleep apnea (adult) (pediatric): Secondary | ICD-10-CM | POA: Diagnosis not present

## 2015-02-09 DIAGNOSIS — J310 Chronic rhinitis: Secondary | ICD-10-CM | POA: Diagnosis not present

## 2015-02-23 DIAGNOSIS — R1013 Epigastric pain: Secondary | ICD-10-CM | POA: Diagnosis not present

## 2015-02-23 DIAGNOSIS — K5900 Constipation, unspecified: Secondary | ICD-10-CM | POA: Diagnosis not present

## 2015-03-07 DIAGNOSIS — Z23 Encounter for immunization: Secondary | ICD-10-CM | POA: Diagnosis not present

## 2015-03-07 DIAGNOSIS — D519 Vitamin B12 deficiency anemia, unspecified: Secondary | ICD-10-CM | POA: Diagnosis not present

## 2015-03-14 DIAGNOSIS — H3531 Nonexudative age-related macular degeneration: Secondary | ICD-10-CM | POA: Diagnosis not present

## 2015-04-05 DIAGNOSIS — N184 Chronic kidney disease, stage 4 (severe): Secondary | ICD-10-CM | POA: Diagnosis not present

## 2015-04-05 DIAGNOSIS — R109 Unspecified abdominal pain: Secondary | ICD-10-CM | POA: Diagnosis not present

## 2015-04-05 DIAGNOSIS — Z79899 Other long term (current) drug therapy: Secondary | ICD-10-CM | POA: Diagnosis not present

## 2015-04-05 DIAGNOSIS — M5416 Radiculopathy, lumbar region: Secondary | ICD-10-CM | POA: Diagnosis not present

## 2015-04-05 DIAGNOSIS — I4891 Unspecified atrial fibrillation: Secondary | ICD-10-CM | POA: Diagnosis not present

## 2015-04-05 DIAGNOSIS — R609 Edema, unspecified: Secondary | ICD-10-CM | POA: Diagnosis not present

## 2015-04-05 DIAGNOSIS — D519 Vitamin B12 deficiency anemia, unspecified: Secondary | ICD-10-CM | POA: Diagnosis not present

## 2015-04-05 DIAGNOSIS — T148 Other injury of unspecified body region: Secondary | ICD-10-CM | POA: Diagnosis not present

## 2015-05-04 DIAGNOSIS — I5032 Chronic diastolic (congestive) heart failure: Secondary | ICD-10-CM | POA: Insufficient documentation

## 2015-05-04 DIAGNOSIS — Z7901 Long term (current) use of anticoagulants: Secondary | ICD-10-CM | POA: Insufficient documentation

## 2015-05-04 DIAGNOSIS — I482 Chronic atrial fibrillation, unspecified: Secondary | ICD-10-CM | POA: Insufficient documentation

## 2015-05-04 DIAGNOSIS — N184 Chronic kidney disease, stage 4 (severe): Secondary | ICD-10-CM

## 2015-05-04 DIAGNOSIS — I130 Hypertensive heart and chronic kidney disease with heart failure and stage 1 through stage 4 chronic kidney disease, or unspecified chronic kidney disease: Secondary | ICD-10-CM | POA: Insufficient documentation

## 2015-05-04 HISTORY — DX: Hypertensive heart and chronic kidney disease with heart failure and stage 1 through stage 4 chronic kidney disease, or unspecified chronic kidney disease: I13.0

## 2015-05-04 HISTORY — DX: Chronic atrial fibrillation, unspecified: I48.20

## 2015-05-04 HISTORY — DX: Chronic diastolic (congestive) heart failure: I50.32

## 2015-05-04 HISTORY — DX: Chronic kidney disease, stage 4 (severe): N18.4

## 2015-05-04 HISTORY — DX: Long term (current) use of anticoagulants: Z79.01

## 2015-05-05 DIAGNOSIS — I130 Hypertensive heart and chronic kidney disease with heart failure and stage 1 through stage 4 chronic kidney disease, or unspecified chronic kidney disease: Secondary | ICD-10-CM | POA: Diagnosis not present

## 2015-05-05 DIAGNOSIS — I509 Heart failure, unspecified: Secondary | ICD-10-CM | POA: Diagnosis not present

## 2015-05-05 DIAGNOSIS — Z7901 Long term (current) use of anticoagulants: Secondary | ICD-10-CM | POA: Diagnosis not present

## 2015-05-05 DIAGNOSIS — I5032 Chronic diastolic (congestive) heart failure: Secondary | ICD-10-CM | POA: Diagnosis not present

## 2015-05-05 DIAGNOSIS — N189 Chronic kidney disease, unspecified: Secondary | ICD-10-CM | POA: Diagnosis not present

## 2015-05-05 DIAGNOSIS — I482 Chronic atrial fibrillation: Secondary | ICD-10-CM | POA: Diagnosis not present

## 2015-05-10 DIAGNOSIS — D519 Vitamin B12 deficiency anemia, unspecified: Secondary | ICD-10-CM | POA: Diagnosis not present

## 2015-05-26 DIAGNOSIS — N2581 Secondary hyperparathyroidism of renal origin: Secondary | ICD-10-CM | POA: Diagnosis not present

## 2015-05-26 DIAGNOSIS — N189 Chronic kidney disease, unspecified: Secondary | ICD-10-CM | POA: Diagnosis not present

## 2015-05-30 DIAGNOSIS — D631 Anemia in chronic kidney disease: Secondary | ICD-10-CM | POA: Diagnosis not present

## 2015-05-30 DIAGNOSIS — N189 Chronic kidney disease, unspecified: Secondary | ICD-10-CM | POA: Diagnosis not present

## 2015-05-30 DIAGNOSIS — N2581 Secondary hyperparathyroidism of renal origin: Secondary | ICD-10-CM | POA: Diagnosis not present

## 2015-05-30 DIAGNOSIS — I129 Hypertensive chronic kidney disease with stage 1 through stage 4 chronic kidney disease, or unspecified chronic kidney disease: Secondary | ICD-10-CM | POA: Diagnosis not present

## 2015-06-01 DIAGNOSIS — R05 Cough: Secondary | ICD-10-CM | POA: Diagnosis not present

## 2015-06-01 DIAGNOSIS — J310 Chronic rhinitis: Secondary | ICD-10-CM | POA: Diagnosis not present

## 2015-06-01 DIAGNOSIS — J449 Chronic obstructive pulmonary disease, unspecified: Secondary | ICD-10-CM | POA: Diagnosis not present

## 2015-06-01 DIAGNOSIS — G4733 Obstructive sleep apnea (adult) (pediatric): Secondary | ICD-10-CM | POA: Diagnosis not present

## 2015-06-09 DIAGNOSIS — R609 Edema, unspecified: Secondary | ICD-10-CM | POA: Diagnosis not present

## 2015-06-09 DIAGNOSIS — I10 Essential (primary) hypertension: Secondary | ICD-10-CM | POA: Diagnosis not present

## 2015-06-09 DIAGNOSIS — D519 Vitamin B12 deficiency anemia, unspecified: Secondary | ICD-10-CM | POA: Diagnosis not present

## 2015-06-09 DIAGNOSIS — T148 Other injury of unspecified body region: Secondary | ICD-10-CM | POA: Diagnosis not present

## 2015-06-09 DIAGNOSIS — B354 Tinea corporis: Secondary | ICD-10-CM | POA: Diagnosis not present

## 2015-06-09 DIAGNOSIS — E538 Deficiency of other specified B group vitamins: Secondary | ICD-10-CM | POA: Diagnosis not present

## 2015-06-09 DIAGNOSIS — N184 Chronic kidney disease, stage 4 (severe): Secondary | ICD-10-CM | POA: Diagnosis not present

## 2015-06-09 DIAGNOSIS — I4891 Unspecified atrial fibrillation: Secondary | ICD-10-CM | POA: Diagnosis not present

## 2015-06-09 DIAGNOSIS — M5416 Radiculopathy, lumbar region: Secondary | ICD-10-CM | POA: Diagnosis not present

## 2015-08-02 DIAGNOSIS — N184 Chronic kidney disease, stage 4 (severe): Secondary | ICD-10-CM | POA: Diagnosis not present

## 2015-08-02 DIAGNOSIS — E538 Deficiency of other specified B group vitamins: Secondary | ICD-10-CM | POA: Diagnosis not present

## 2015-08-02 DIAGNOSIS — I10 Essential (primary) hypertension: Secondary | ICD-10-CM | POA: Diagnosis not present

## 2015-08-02 DIAGNOSIS — T148 Other injury of unspecified body region: Secondary | ICD-10-CM | POA: Diagnosis not present

## 2015-08-02 DIAGNOSIS — R609 Edema, unspecified: Secondary | ICD-10-CM | POA: Diagnosis not present

## 2015-08-02 DIAGNOSIS — I4891 Unspecified atrial fibrillation: Secondary | ICD-10-CM | POA: Diagnosis not present

## 2015-08-02 DIAGNOSIS — D519 Vitamin B12 deficiency anemia, unspecified: Secondary | ICD-10-CM | POA: Diagnosis not present

## 2015-08-02 DIAGNOSIS — M5416 Radiculopathy, lumbar region: Secondary | ICD-10-CM | POA: Diagnosis not present

## 2015-09-02 DIAGNOSIS — D519 Vitamin B12 deficiency anemia, unspecified: Secondary | ICD-10-CM | POA: Diagnosis not present

## 2015-09-06 DIAGNOSIS — R05 Cough: Secondary | ICD-10-CM | POA: Diagnosis not present

## 2015-09-06 DIAGNOSIS — G4733 Obstructive sleep apnea (adult) (pediatric): Secondary | ICD-10-CM | POA: Diagnosis not present

## 2015-09-06 DIAGNOSIS — J310 Chronic rhinitis: Secondary | ICD-10-CM | POA: Diagnosis not present

## 2015-09-06 DIAGNOSIS — J449 Chronic obstructive pulmonary disease, unspecified: Secondary | ICD-10-CM | POA: Diagnosis not present

## 2015-09-08 DIAGNOSIS — T148 Other injury of unspecified body region: Secondary | ICD-10-CM | POA: Diagnosis not present

## 2015-09-08 DIAGNOSIS — Z1389 Encounter for screening for other disorder: Secondary | ICD-10-CM | POA: Diagnosis not present

## 2015-09-08 DIAGNOSIS — I10 Essential (primary) hypertension: Secondary | ICD-10-CM | POA: Diagnosis not present

## 2015-09-08 DIAGNOSIS — R609 Edema, unspecified: Secondary | ICD-10-CM | POA: Diagnosis not present

## 2015-09-08 DIAGNOSIS — E559 Vitamin D deficiency, unspecified: Secondary | ICD-10-CM | POA: Diagnosis not present

## 2015-09-08 DIAGNOSIS — N184 Chronic kidney disease, stage 4 (severe): Secondary | ICD-10-CM | POA: Diagnosis not present

## 2015-09-08 DIAGNOSIS — I4891 Unspecified atrial fibrillation: Secondary | ICD-10-CM | POA: Diagnosis not present

## 2015-09-08 DIAGNOSIS — M5416 Radiculopathy, lumbar region: Secondary | ICD-10-CM | POA: Diagnosis not present

## 2015-09-08 DIAGNOSIS — E538 Deficiency of other specified B group vitamins: Secondary | ICD-10-CM | POA: Diagnosis not present

## 2015-09-12 DIAGNOSIS — H353133 Nonexudative age-related macular degeneration, bilateral, advanced atrophic without subfoveal involvement: Secondary | ICD-10-CM | POA: Diagnosis not present

## 2015-10-04 DIAGNOSIS — N2581 Secondary hyperparathyroidism of renal origin: Secondary | ICD-10-CM | POA: Diagnosis not present

## 2015-10-04 DIAGNOSIS — N189 Chronic kidney disease, unspecified: Secondary | ICD-10-CM | POA: Diagnosis not present

## 2015-10-06 DIAGNOSIS — D631 Anemia in chronic kidney disease: Secondary | ICD-10-CM | POA: Diagnosis not present

## 2015-10-06 DIAGNOSIS — I129 Hypertensive chronic kidney disease with stage 1 through stage 4 chronic kidney disease, or unspecified chronic kidney disease: Secondary | ICD-10-CM | POA: Diagnosis not present

## 2015-10-06 DIAGNOSIS — N189 Chronic kidney disease, unspecified: Secondary | ICD-10-CM | POA: Diagnosis not present

## 2015-10-06 DIAGNOSIS — N2581 Secondary hyperparathyroidism of renal origin: Secondary | ICD-10-CM | POA: Diagnosis not present

## 2015-10-18 DIAGNOSIS — E538 Deficiency of other specified B group vitamins: Secondary | ICD-10-CM | POA: Diagnosis not present

## 2015-10-18 DIAGNOSIS — I4891 Unspecified atrial fibrillation: Secondary | ICD-10-CM | POA: Diagnosis not present

## 2015-10-18 DIAGNOSIS — M5416 Radiculopathy, lumbar region: Secondary | ICD-10-CM | POA: Diagnosis not present

## 2015-10-18 DIAGNOSIS — I10 Essential (primary) hypertension: Secondary | ICD-10-CM | POA: Diagnosis not present

## 2015-10-18 DIAGNOSIS — N184 Chronic kidney disease, stage 4 (severe): Secondary | ICD-10-CM | POA: Diagnosis not present

## 2015-10-18 DIAGNOSIS — D519 Vitamin B12 deficiency anemia, unspecified: Secondary | ICD-10-CM | POA: Diagnosis not present

## 2015-11-21 DIAGNOSIS — D519 Vitamin B12 deficiency anemia, unspecified: Secondary | ICD-10-CM | POA: Diagnosis not present

## 2015-11-22 DIAGNOSIS — R400 Somnolence: Secondary | ICD-10-CM | POA: Diagnosis not present

## 2015-11-22 DIAGNOSIS — G4733 Obstructive sleep apnea (adult) (pediatric): Secondary | ICD-10-CM | POA: Diagnosis not present

## 2015-11-22 DIAGNOSIS — J449 Chronic obstructive pulmonary disease, unspecified: Secondary | ICD-10-CM | POA: Diagnosis not present

## 2015-11-22 DIAGNOSIS — J310 Chronic rhinitis: Secondary | ICD-10-CM | POA: Diagnosis not present

## 2015-12-27 DIAGNOSIS — R600 Localized edema: Secondary | ICD-10-CM | POA: Diagnosis not present

## 2015-12-27 DIAGNOSIS — Z5181 Encounter for therapeutic drug level monitoring: Secondary | ICD-10-CM | POA: Diagnosis not present

## 2015-12-27 DIAGNOSIS — M5416 Radiculopathy, lumbar region: Secondary | ICD-10-CM | POA: Diagnosis not present

## 2015-12-27 DIAGNOSIS — D519 Vitamin B12 deficiency anemia, unspecified: Secondary | ICD-10-CM | POA: Diagnosis not present

## 2015-12-27 DIAGNOSIS — Z79899 Other long term (current) drug therapy: Secondary | ICD-10-CM | POA: Diagnosis not present

## 2016-01-05 DIAGNOSIS — N189 Chronic kidney disease, unspecified: Secondary | ICD-10-CM | POA: Diagnosis not present

## 2016-01-05 DIAGNOSIS — Z7901 Long term (current) use of anticoagulants: Secondary | ICD-10-CM | POA: Diagnosis not present

## 2016-01-05 DIAGNOSIS — I482 Chronic atrial fibrillation: Secondary | ICD-10-CM | POA: Diagnosis not present

## 2016-01-05 DIAGNOSIS — I5032 Chronic diastolic (congestive) heart failure: Secondary | ICD-10-CM | POA: Diagnosis not present

## 2016-01-05 DIAGNOSIS — I130 Hypertensive heart and chronic kidney disease with heart failure and stage 1 through stage 4 chronic kidney disease, or unspecified chronic kidney disease: Secondary | ICD-10-CM | POA: Diagnosis not present

## 2016-01-05 DIAGNOSIS — I509 Heart failure, unspecified: Secondary | ICD-10-CM | POA: Diagnosis not present

## 2016-01-23 DIAGNOSIS — N2581 Secondary hyperparathyroidism of renal origin: Secondary | ICD-10-CM | POA: Diagnosis not present

## 2016-01-23 DIAGNOSIS — N189 Chronic kidney disease, unspecified: Secondary | ICD-10-CM | POA: Diagnosis not present

## 2016-01-25 DIAGNOSIS — N2581 Secondary hyperparathyroidism of renal origin: Secondary | ICD-10-CM | POA: Diagnosis not present

## 2016-01-25 DIAGNOSIS — I129 Hypertensive chronic kidney disease with stage 1 through stage 4 chronic kidney disease, or unspecified chronic kidney disease: Secondary | ICD-10-CM | POA: Diagnosis not present

## 2016-01-25 DIAGNOSIS — D631 Anemia in chronic kidney disease: Secondary | ICD-10-CM | POA: Diagnosis not present

## 2016-01-25 DIAGNOSIS — N189 Chronic kidney disease, unspecified: Secondary | ICD-10-CM | POA: Diagnosis not present

## 2016-01-27 DIAGNOSIS — J449 Chronic obstructive pulmonary disease, unspecified: Secondary | ICD-10-CM | POA: Diagnosis not present

## 2016-01-27 DIAGNOSIS — N189 Chronic kidney disease, unspecified: Secondary | ICD-10-CM | POA: Diagnosis not present

## 2016-01-27 DIAGNOSIS — Z1389 Encounter for screening for other disorder: Secondary | ICD-10-CM | POA: Diagnosis not present

## 2016-01-27 DIAGNOSIS — I4891 Unspecified atrial fibrillation: Secondary | ICD-10-CM | POA: Diagnosis not present

## 2016-01-27 DIAGNOSIS — M810 Age-related osteoporosis without current pathological fracture: Secondary | ICD-10-CM | POA: Diagnosis not present

## 2016-01-27 DIAGNOSIS — M5416 Radiculopathy, lumbar region: Secondary | ICD-10-CM | POA: Diagnosis not present

## 2016-01-27 DIAGNOSIS — E785 Hyperlipidemia, unspecified: Secondary | ICD-10-CM | POA: Diagnosis not present

## 2016-01-27 DIAGNOSIS — E509 Vitamin A deficiency, unspecified: Secondary | ICD-10-CM | POA: Diagnosis not present

## 2016-01-27 DIAGNOSIS — E538 Deficiency of other specified B group vitamins: Secondary | ICD-10-CM | POA: Diagnosis not present

## 2016-01-31 DIAGNOSIS — G4733 Obstructive sleep apnea (adult) (pediatric): Secondary | ICD-10-CM | POA: Diagnosis not present

## 2016-01-31 DIAGNOSIS — J310 Chronic rhinitis: Secondary | ICD-10-CM | POA: Diagnosis not present

## 2016-01-31 DIAGNOSIS — R400 Somnolence: Secondary | ICD-10-CM | POA: Diagnosis not present

## 2016-02-01 DIAGNOSIS — Z79899 Other long term (current) drug therapy: Secondary | ICD-10-CM | POA: Diagnosis not present

## 2016-02-21 IMAGING — MR MR LUMBAR SPINE W/O CM
4 of 5 series · 22 of 48 positions shown · non-contrast
Comparison: None.

CLINICAL DATA: Lumbago/low back pain. Radiculopathy. Lumbar spine
compression fracture.

EXAM:
MRI LUMBAR SPINE WITHOUT CONTRAST
TECHNIQUE: Multiplanar, multisequence MR imaging of the lumbar spine was
performed. No intravenous contrast was administered.

[Series 2: T2 · sagittal · 4.0mm · 0.44mm/px · 6 of 12 slices shown (1 of 2)]
[im 1/12]
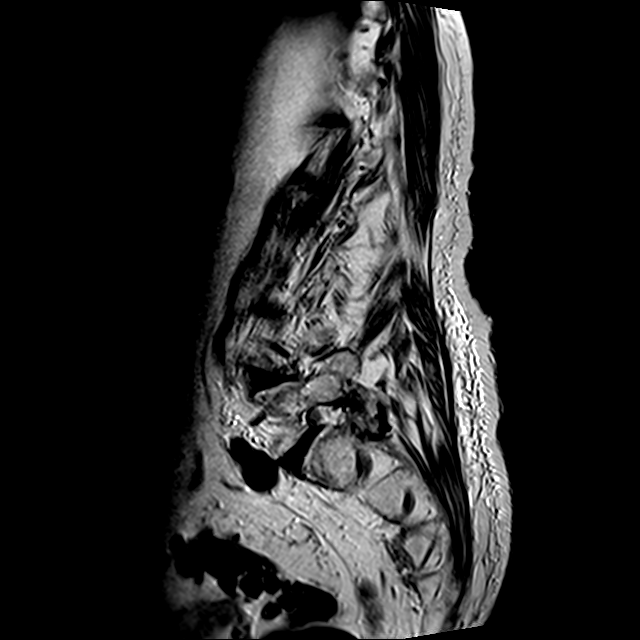
[im 3/12]
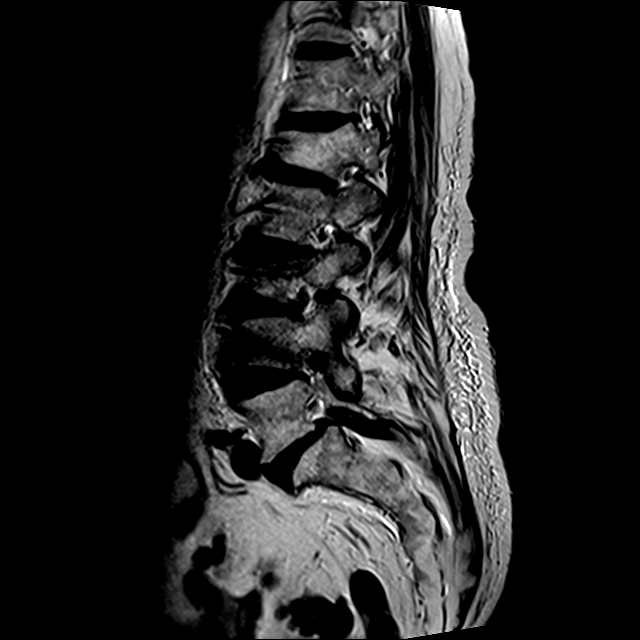
[im 5/12]
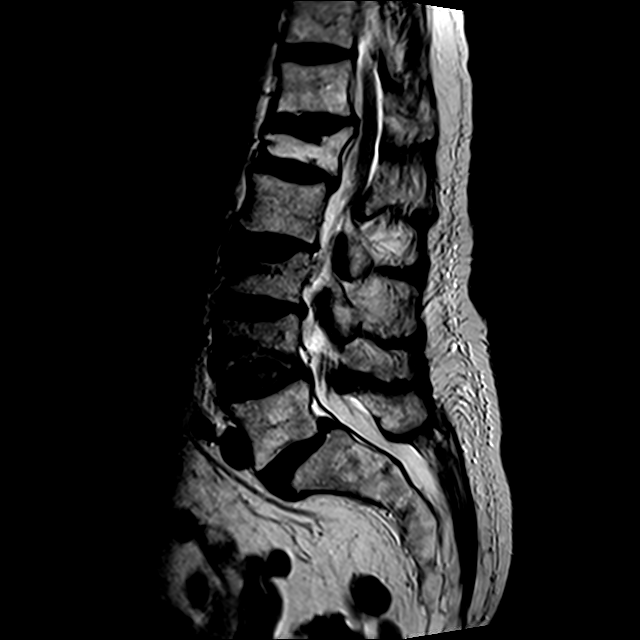
[im 7/12]
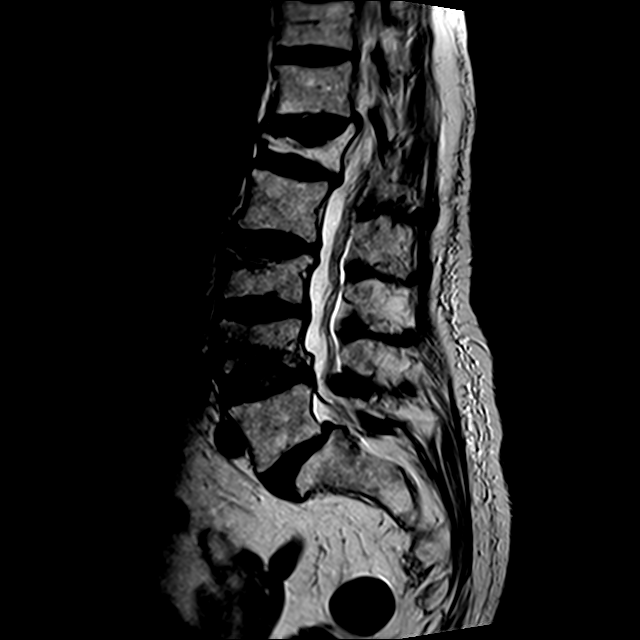
[im 9/12]
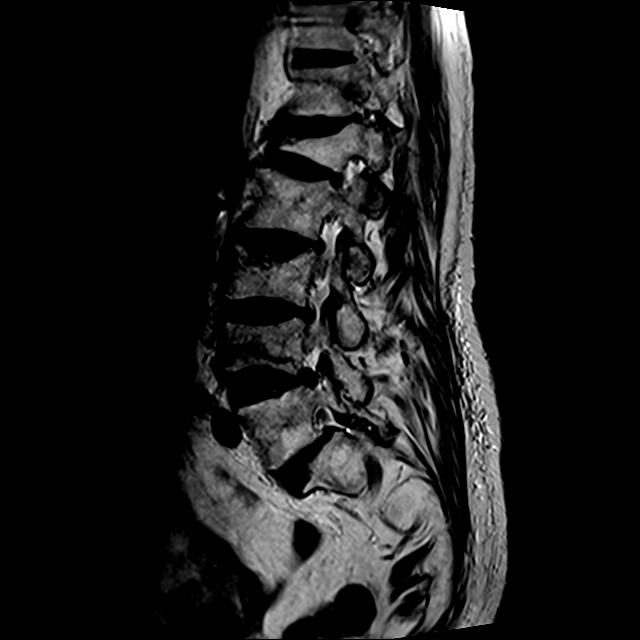
[im 12/12]
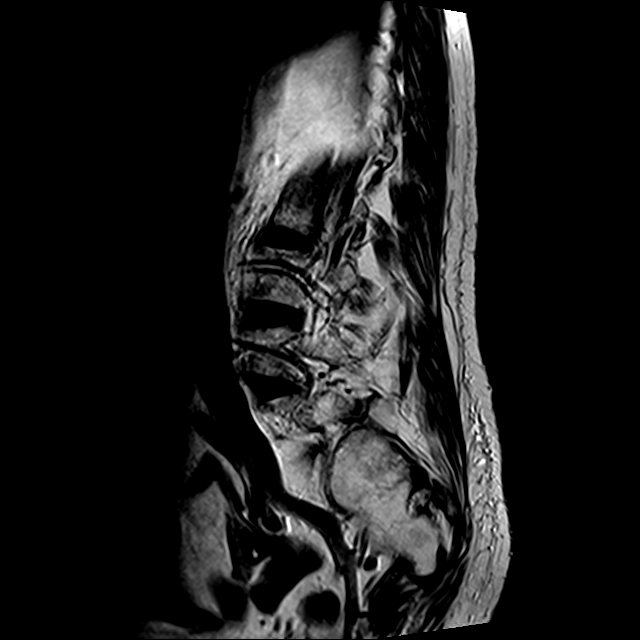

[Series 3: T1 · sagittal · 4.0mm · 0.55mm/px · 4 of 12 slices shown (1 of 2)]
[im 1/12]
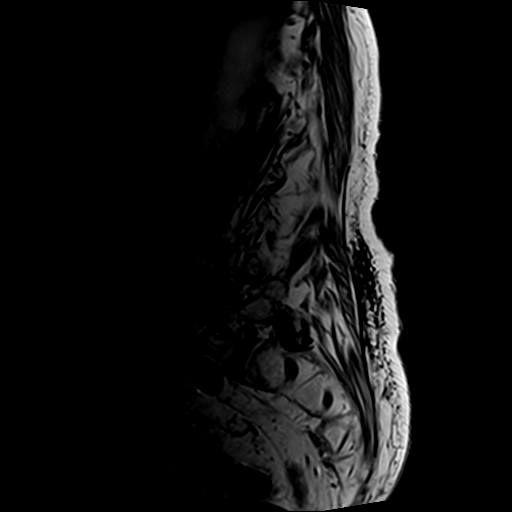
[im 3/12]
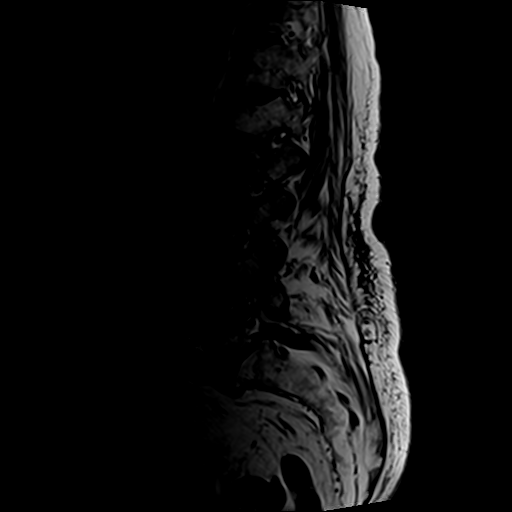
[im 7/12]
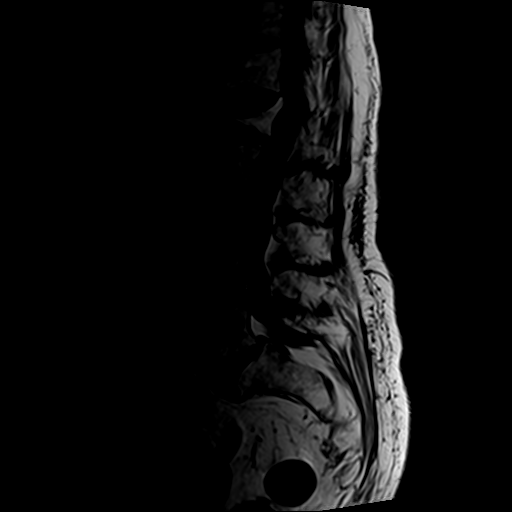
[im 12/12]
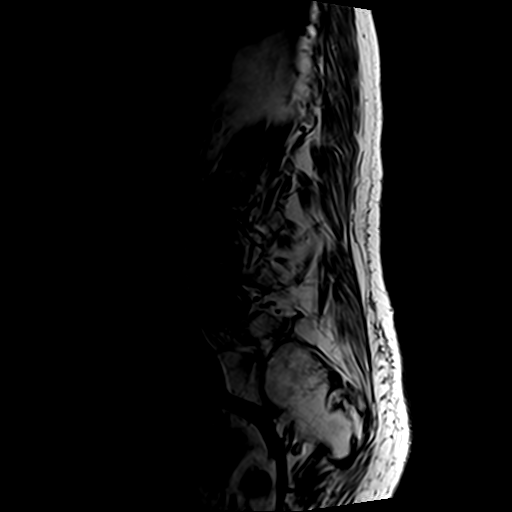

[Series 4: T1 · axial · 4.0mm · 0.37mm/px · z∈[-52,+97]mm · 3 of 32 slices shown (2 of 2)]
[im 5/32]
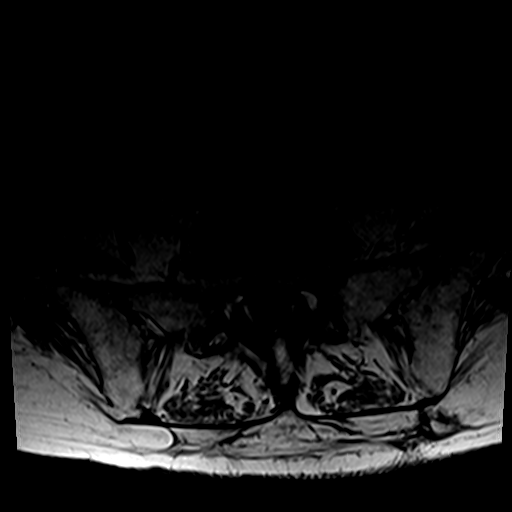
[im 16/32]
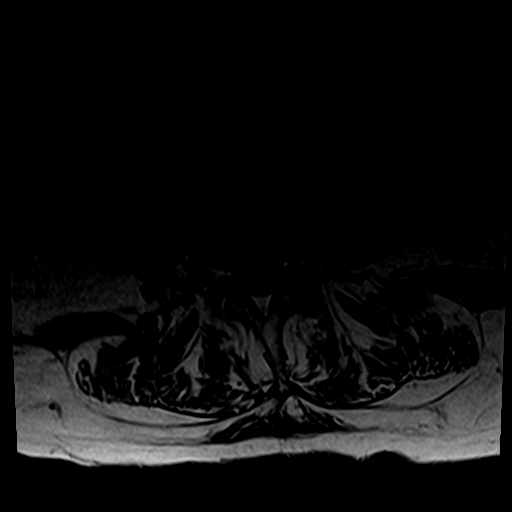
[im 27/32]
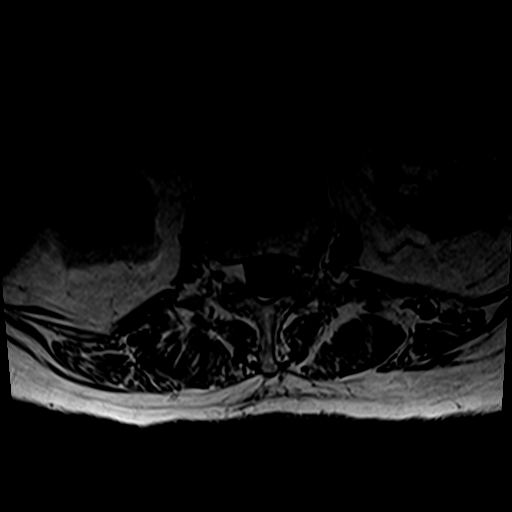

[Series 6: T2 · axial · 4.0mm · 0.74mm/px · z∈[-72,+123]mm · 9 of 32 slices shown (2 of 2)]
[im 1/32]
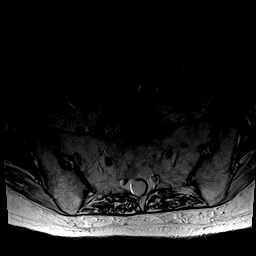
[im 5/32]
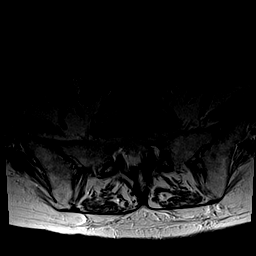
[im 9/32]
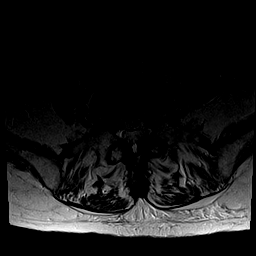
[im 14/32]
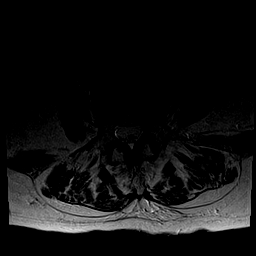
[im 16/32]
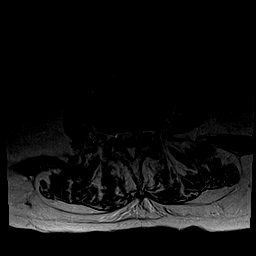
[im 18/32]
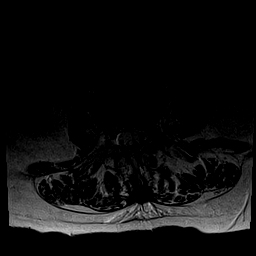
[im 23/32]
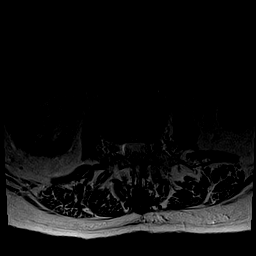
[im 27/32]
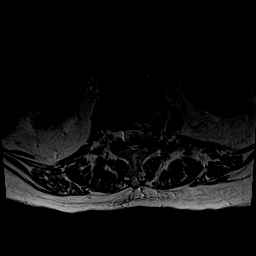
[im 32/32]
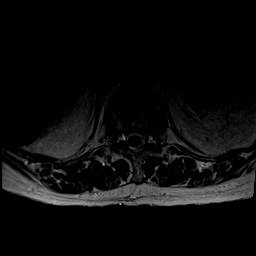

[22 of 48 positions shown; findings below may reference images not displayed]

FINDINGS: The numbering convention used for this exam termed L5-S1 as the last
intervertebral disc space.

Alignment: Grade I anterolisthesis of L5 on S1 measuring 6 mm. Mild
dextroconvex curve of the lumbar spine with the apex at L3.

Vertebrae: Chronic L1 compression fracture with 75% loss of
vertebral body height and mild retropulsion. Mild degenerative
discogenic bone marrow edema along the superior endplate.

L3 acute or subacute compression fracture with 50% loss of vertebral
body height. No retropulsion. Superior endplate edema.

L4 inferior endplate compression fracture is acute or subacute. No
retropulsion. Inferior endplate edema.

Conus medullaris: Normal at L2.

Paraspinal tissues: Renal atrophy and RIGHT renal cystic lesions,
likely cysts. Paraspinal soft tissue evaluation degraded by motion
artifact.

Disc levels:

Disc Signal: Diffuse disc desiccation and degeneration.

T12-L1: Mild central stenosis associated with facet arthrosis and
retropulsion with broad-based posterior disc bulging. Narrowing of
the ventral subarachnoid space but no cord compression. Foramina
appear patent.

L1-L2:  Shallow disc bulging.  Central canal and foramina patent.

L2-L3:  Disc desiccation.  No stenosis.

L3-L4: Mild facet arthrosis and posterior ligamentum flavum
redundancy. Central canal and foramina patent.

L4-L5: Mild to moderate central stenosis is multifactorial.
Broad-based posterior disc bulging. Posterior ligamentum flavum
redundancy and facet arthrosis. Bilateral subarticular encroachment
associated with ligamentum flavum redundancy. Mild to moderate
bilateral foraminal stenosis. The disc bulging is RIGHT eccentric
avium contributes to RIGHT foraminal stenosis potentially affecting
the RIGHT L4 nerve.

L5-S1: Facet arthrosis with grade I anterolisthesis. Uncoverage of
the disc. Mild bilateral foraminal stenosis.
IMPRESSION: 1. Acute or subacute L3 and L4 compression fractures with about 50%
collapse at both levels.
2. Chronic L1 superior endplate compression fracture with
retropulsion producing mild central stenosis.
3. Moderate to severe lumbar degenerative disc and facet disease
detailed above.

## 2016-02-29 DIAGNOSIS — E538 Deficiency of other specified B group vitamins: Secondary | ICD-10-CM | POA: Diagnosis not present

## 2016-03-28 DIAGNOSIS — J310 Chronic rhinitis: Secondary | ICD-10-CM | POA: Diagnosis not present

## 2016-03-28 DIAGNOSIS — R05 Cough: Secondary | ICD-10-CM | POA: Diagnosis not present

## 2016-03-28 DIAGNOSIS — J449 Chronic obstructive pulmonary disease, unspecified: Secondary | ICD-10-CM | POA: Diagnosis not present

## 2016-03-28 DIAGNOSIS — G4733 Obstructive sleep apnea (adult) (pediatric): Secondary | ICD-10-CM | POA: Diagnosis not present

## 2016-03-30 DIAGNOSIS — D519 Vitamin B12 deficiency anemia, unspecified: Secondary | ICD-10-CM | POA: Diagnosis not present

## 2016-03-30 DIAGNOSIS — J302 Other seasonal allergic rhinitis: Secondary | ICD-10-CM | POA: Diagnosis not present

## 2016-03-30 DIAGNOSIS — M5416 Radiculopathy, lumbar region: Secondary | ICD-10-CM | POA: Diagnosis not present

## 2016-03-30 DIAGNOSIS — R600 Localized edema: Secondary | ICD-10-CM | POA: Diagnosis not present

## 2016-06-01 DIAGNOSIS — D519 Vitamin B12 deficiency anemia, unspecified: Secondary | ICD-10-CM | POA: Diagnosis not present

## 2016-06-01 DIAGNOSIS — L602 Onychogryphosis: Secondary | ICD-10-CM | POA: Diagnosis not present

## 2016-06-01 DIAGNOSIS — Z6823 Body mass index (BMI) 23.0-23.9, adult: Secondary | ICD-10-CM | POA: Diagnosis not present

## 2016-06-01 DIAGNOSIS — Z79899 Other long term (current) drug therapy: Secondary | ICD-10-CM | POA: Diagnosis not present

## 2016-06-01 DIAGNOSIS — R600 Localized edema: Secondary | ICD-10-CM | POA: Diagnosis not present

## 2016-06-01 DIAGNOSIS — Z5181 Encounter for therapeutic drug level monitoring: Secondary | ICD-10-CM | POA: Diagnosis not present

## 2016-06-01 DIAGNOSIS — M5416 Radiculopathy, lumbar region: Secondary | ICD-10-CM | POA: Diagnosis not present

## 2016-06-10 DIAGNOSIS — I110 Hypertensive heart disease with heart failure: Secondary | ICD-10-CM | POA: Diagnosis not present

## 2016-06-10 DIAGNOSIS — I509 Heart failure, unspecified: Secondary | ICD-10-CM | POA: Diagnosis not present

## 2016-06-10 DIAGNOSIS — N179 Acute kidney failure, unspecified: Secondary | ICD-10-CM | POA: Diagnosis not present

## 2016-06-10 DIAGNOSIS — R0602 Shortness of breath: Secondary | ICD-10-CM | POA: Diagnosis not present

## 2016-06-10 DIAGNOSIS — I129 Hypertensive chronic kidney disease with stage 1 through stage 4 chronic kidney disease, or unspecified chronic kidney disease: Secondary | ICD-10-CM | POA: Diagnosis not present

## 2016-06-10 DIAGNOSIS — N189 Chronic kidney disease, unspecified: Secondary | ICD-10-CM | POA: Diagnosis not present

## 2016-06-10 DIAGNOSIS — I130 Hypertensive heart and chronic kidney disease with heart failure and stage 1 through stage 4 chronic kidney disease, or unspecified chronic kidney disease: Secondary | ICD-10-CM | POA: Diagnosis not present

## 2016-06-11 DIAGNOSIS — R0602 Shortness of breath: Secondary | ICD-10-CM | POA: Diagnosis not present

## 2016-06-29 ENCOUNTER — Ambulatory Visit: Payer: Self-pay | Admitting: Sports Medicine

## 2016-07-09 DIAGNOSIS — D519 Vitamin B12 deficiency anemia, unspecified: Secondary | ICD-10-CM | POA: Diagnosis not present

## 2016-07-09 DIAGNOSIS — M25519 Pain in unspecified shoulder: Secondary | ICD-10-CM | POA: Diagnosis not present

## 2016-07-09 DIAGNOSIS — R5383 Other fatigue: Secondary | ICD-10-CM | POA: Diagnosis not present

## 2016-07-12 DIAGNOSIS — J449 Chronic obstructive pulmonary disease, unspecified: Secondary | ICD-10-CM | POA: Diagnosis not present

## 2016-07-12 DIAGNOSIS — J310 Chronic rhinitis: Secondary | ICD-10-CM | POA: Diagnosis not present

## 2016-08-03 DIAGNOSIS — J449 Chronic obstructive pulmonary disease, unspecified: Secondary | ICD-10-CM | POA: Diagnosis not present

## 2016-08-03 DIAGNOSIS — Z5181 Encounter for therapeutic drug level monitoring: Secondary | ICD-10-CM | POA: Diagnosis not present

## 2016-08-03 DIAGNOSIS — R600 Localized edema: Secondary | ICD-10-CM | POA: Diagnosis not present

## 2016-08-03 DIAGNOSIS — Z79899 Other long term (current) drug therapy: Secondary | ICD-10-CM | POA: Diagnosis not present

## 2016-08-03 DIAGNOSIS — M25512 Pain in left shoulder: Secondary | ICD-10-CM | POA: Diagnosis not present

## 2016-08-03 DIAGNOSIS — J302 Other seasonal allergic rhinitis: Secondary | ICD-10-CM | POA: Diagnosis not present

## 2016-08-03 DIAGNOSIS — M5416 Radiculopathy, lumbar region: Secondary | ICD-10-CM | POA: Diagnosis not present

## 2016-08-03 DIAGNOSIS — N189 Chronic kidney disease, unspecified: Secondary | ICD-10-CM | POA: Diagnosis not present

## 2016-08-03 DIAGNOSIS — E538 Deficiency of other specified B group vitamins: Secondary | ICD-10-CM | POA: Diagnosis not present

## 2016-08-03 DIAGNOSIS — N2581 Secondary hyperparathyroidism of renal origin: Secondary | ICD-10-CM | POA: Diagnosis not present

## 2016-08-20 DIAGNOSIS — N189 Chronic kidney disease, unspecified: Secondary | ICD-10-CM | POA: Diagnosis not present

## 2016-08-20 DIAGNOSIS — D631 Anemia in chronic kidney disease: Secondary | ICD-10-CM | POA: Diagnosis not present

## 2016-08-20 DIAGNOSIS — N2581 Secondary hyperparathyroidism of renal origin: Secondary | ICD-10-CM | POA: Diagnosis not present

## 2016-08-20 DIAGNOSIS — I129 Hypertensive chronic kidney disease with stage 1 through stage 4 chronic kidney disease, or unspecified chronic kidney disease: Secondary | ICD-10-CM | POA: Diagnosis not present

## 2016-09-04 DIAGNOSIS — E538 Deficiency of other specified B group vitamins: Secondary | ICD-10-CM | POA: Diagnosis not present

## 2016-09-07 ENCOUNTER — Ambulatory Visit (INDEPENDENT_AMBULATORY_CARE_PROVIDER_SITE_OTHER): Payer: Medicare Other | Admitting: Sports Medicine

## 2016-09-07 ENCOUNTER — Encounter: Payer: Self-pay | Admitting: Sports Medicine

## 2016-09-07 DIAGNOSIS — M79609 Pain in unspecified limb: Secondary | ICD-10-CM | POA: Diagnosis not present

## 2016-09-07 DIAGNOSIS — B351 Tinea unguium: Secondary | ICD-10-CM

## 2016-09-07 DIAGNOSIS — I739 Peripheral vascular disease, unspecified: Secondary | ICD-10-CM

## 2016-09-08 NOTE — Progress Notes (Signed)
Subjective: Tiffany Grant is a 83 y.o. female patient seen today in office with complaint of painful thickened and elongated toenails; unable to trim. Patient denies history of Diabetes or Neuropathy. Patient on xalerto and has 2 stents. Patient has no other pedal complaints at this time.   Patient Active Problem List   Diagnosis Date Noted  . Chronic a-fib ([place]) 05/04/2015  . Chronic anticoagulation 05/04/2015  . Chronic diastolic heart failure ([place]) 05/04/2015  . CKD (chronic kidney disease) stage 4, GFR 15-29 ml/min (HCC) 05/04/2015  . Hypertensive heart disease with congestive heart failure and chronic kidney disease ([place]) 05/04/2015    No current outpatient prescriptions on file prior to visit.   No current facility-administered medications on file prior to visit.     Allergies  Allergen Reactions  . Carbamazepine     Unknown  . Clonidine     Unknown  . Iodinated Diagnostic Agents     Unknown    Objective: Physical Exam  General: Well developed, nourished, no acute distress, awake, alert and oriented x 3  Vascular: Dorsalis pedis artery 1/4 bilateral, Posterior tibial artery 1/4 bilateral, skin temperature warm to warm proximal to distal bilateral lower extremities, + varicosities, no pedal hair present bilateral.  Neurological: Gross sensation present via light touch bilateral.   Dermatological: Skin is warm, dry, and supple bilateral, Nails 1-10 are tender, long, thick, and discolored with moerate subungal debris most involved is 1st toenails bilateral, no webspace macerations present bilateral, no open lesions present bilateral, no callus/corns/hyperkeratotic tissue present bilateral. No signs of infection bilateral.  Musculoskeletal: No symptomatic boney deformities noted bilateral. Muscular strength within normal limits without painon range of motion. No pain with calf compression bilateral.  Assessment and Plan:  Problem List Items Addressed This Visit    None     Visit Diagnoses    Pain due to onychomycosis of nail    -  Primary   PVD (peripheral vascular disease) (HCC)       Relevant Medications   diltiazem (CARDIZEM CD) 240 MG 24 hr capsule   furosemide (LASIX) 40 MG tablet   hydrALAZINE (APRESOLINE) 25 MG tablet   isosorbide dinitrate (ISORDIL) 10 MG tablet   metoprolol succinate (TOPROL-XL) 50 MG 24 hr tablet   Rivaroxaban (XARELTO) 15 MG TABS tablet   rosuvastatin (CRESTOR) 20 MG tablet      -Examined patient.  -Discussed treatment options for painful mycotic nails. -Mechanically debrided and reduced mycotic nails with sterile nail nipper and dremel nail file without incident. -Patient to return in 3 months for follow up evaluation or sooner if symptoms worsen.  Landis Martins, DPM

## 2016-10-10 DIAGNOSIS — R400 Somnolence: Secondary | ICD-10-CM | POA: Diagnosis not present

## 2016-10-10 DIAGNOSIS — J449 Chronic obstructive pulmonary disease, unspecified: Secondary | ICD-10-CM | POA: Diagnosis not present

## 2016-10-10 DIAGNOSIS — J310 Chronic rhinitis: Secondary | ICD-10-CM | POA: Diagnosis not present

## 2016-10-12 DIAGNOSIS — J449 Chronic obstructive pulmonary disease, unspecified: Secondary | ICD-10-CM | POA: Diagnosis not present

## 2016-10-12 DIAGNOSIS — E538 Deficiency of other specified B group vitamins: Secondary | ICD-10-CM | POA: Diagnosis not present

## 2016-10-12 DIAGNOSIS — G8929 Other chronic pain: Secondary | ICD-10-CM | POA: Diagnosis not present

## 2016-10-12 DIAGNOSIS — I4891 Unspecified atrial fibrillation: Secondary | ICD-10-CM | POA: Diagnosis not present

## 2016-11-14 DIAGNOSIS — J449 Chronic obstructive pulmonary disease, unspecified: Secondary | ICD-10-CM | POA: Diagnosis not present

## 2016-11-14 DIAGNOSIS — E538 Deficiency of other specified B group vitamins: Secondary | ICD-10-CM | POA: Diagnosis not present

## 2016-11-14 DIAGNOSIS — R400 Somnolence: Secondary | ICD-10-CM | POA: Diagnosis not present

## 2016-11-14 DIAGNOSIS — J310 Chronic rhinitis: Secondary | ICD-10-CM | POA: Diagnosis not present

## 2016-12-06 DIAGNOSIS — G4733 Obstructive sleep apnea (adult) (pediatric): Secondary | ICD-10-CM | POA: Diagnosis not present

## 2016-12-07 ENCOUNTER — Ambulatory Visit: Payer: Medicare Other | Admitting: Sports Medicine

## 2016-12-07 ENCOUNTER — Ambulatory Visit (INDEPENDENT_AMBULATORY_CARE_PROVIDER_SITE_OTHER): Payer: Medicare Other | Admitting: Sports Medicine

## 2016-12-07 DIAGNOSIS — B351 Tinea unguium: Secondary | ICD-10-CM

## 2016-12-07 DIAGNOSIS — M79609 Pain in unspecified limb: Secondary | ICD-10-CM | POA: Diagnosis not present

## 2016-12-07 DIAGNOSIS — I739 Peripheral vascular disease, unspecified: Secondary | ICD-10-CM

## 2016-12-07 NOTE — Progress Notes (Signed)
Subjective: Tiffany Grant is a 83 y.o. female patient seen today in office with complaint of painful thickened and elongated toenails; unable to trim. Patient denies any changes with medical history or medications since last visit. Still on Xarelto for stents. Denies other pedal complaints at this time.   Patient Active Problem List   Diagnosis Date Noted  . Chronic a-fib ([place]) 05/04/2015  . Chronic anticoagulation 05/04/2015  . Chronic diastolic heart failure ([place]) 05/04/2015  . CKD (chronic kidney disease) stage 4, GFR 15-29 ml/min (HCC) 05/04/2015  . Hypertensive heart disease with congestive heart failure and chronic kidney disease ([place]) 05/04/2015    Current Outpatient Prescriptions on File Prior to Visit  Medication Sig Dispense Refill  . calcitRIOL (ROCALTROL) 1 mcg/mL SOLN Take by mouth.    . diltiazem (CARDIZEM CD) 240 MG 24 hr capsule Take 240 mg by mouth.    . fentaNYL (DURAGESIC - DOSED MCG/HR) 25 MCG/HR patch Place onto the skin.    Marland Kitchen FERROUS SULFATE PO Take 325 mg by mouth.    . furosemide (LASIX) 40 MG tablet Take 40 mg by mouth.    . hydrALAZINE (APRESOLINE) 25 MG tablet TAKE ONE TABLET BY MOUTH THREE TIMES DAILY    . isosorbide dinitrate (ISORDIL) 10 MG tablet TAKE ONE TABLET BY MOUTH THREE TIMES DAILY    . metoprolol succinate (TOPROL-XL) 50 MG 24 hr tablet Take 50 mg by mouth.    . Multiple Vitamin (MULTI-VITAMINS) TABS Take by mouth.    . potassium chloride (KLOR-CON) 8 MEQ tablet Take by mouth.    . Rivaroxaban (XARELTO) 15 MG TABS tablet Take 15 mg by mouth.    . rosuvastatin (CRESTOR) 20 MG tablet Take 20 mg by mouth.     No current facility-administered medications on file prior to visit.     Allergies  Allergen Reactions  . Carbamazepine     Unknown  . Clonidine     Unknown  . Iodinated Diagnostic Agents     Unknown    Objective: Physical Exam  General: Well developed, nourished, no acute distress, awake, alert and oriented x 3  Vascular: Dorsalis  pedis artery 1/4 bilateral, Posterior tibial artery 1/4 bilateral, skin temperature warm to warm proximal to distal bilateral lower extremities, + varicosities, no pedal hair present bilateral.  Neurological: Gross sensation present via light touch bilateral.   Dermatological: Skin is warm, dry, and supple bilateral, Nails 1-10 are tender, long, thick, and discolored with moerate subungal debris most involved is 1st toenails bilateral, no webspace macerations present bilateral, no open lesions present bilateral, no callus/corns/hyperkeratotic tissue present bilateral. No signs of infection bilateral.  Musculoskeletal: No symptomatic boney deformities noted bilateral. Muscular strength within normal limits without painon range of motion. No pain with calf compression bilateral.  Assessment and Plan:  Problem List Items Addressed This Visit    None    Visit Diagnoses    Pain due to onychomycosis of nail    -  Primary   PVD (peripheral vascular disease) ([place])          -Examined patient.  -Discussed treatment options for painful mycotic nails. -Mechanically debrided and reduced mycotic nails with sterile nail nipper and dremel nail file without incident. -Patient to return in 3 months for follow up evaluation or sooner if symptoms worsen.  Landis Martins, DPM

## 2016-12-17 DIAGNOSIS — Z79899 Other long term (current) drug therapy: Secondary | ICD-10-CM | POA: Diagnosis not present

## 2016-12-17 DIAGNOSIS — E538 Deficiency of other specified B group vitamins: Secondary | ICD-10-CM | POA: Diagnosis not present

## 2016-12-17 DIAGNOSIS — Z5181 Encounter for therapeutic drug level monitoring: Secondary | ICD-10-CM | POA: Diagnosis not present

## 2016-12-17 DIAGNOSIS — G8929 Other chronic pain: Secondary | ICD-10-CM | POA: Diagnosis not present

## 2016-12-17 DIAGNOSIS — J449 Chronic obstructive pulmonary disease, unspecified: Secondary | ICD-10-CM | POA: Diagnosis not present

## 2016-12-24 DIAGNOSIS — N189 Chronic kidney disease, unspecified: Secondary | ICD-10-CM | POA: Diagnosis not present

## 2016-12-24 DIAGNOSIS — N2581 Secondary hyperparathyroidism of renal origin: Secondary | ICD-10-CM | POA: Diagnosis not present

## 2016-12-25 DIAGNOSIS — N184 Chronic kidney disease, stage 4 (severe): Secondary | ICD-10-CM | POA: Diagnosis not present

## 2016-12-25 DIAGNOSIS — I129 Hypertensive chronic kidney disease with stage 1 through stage 4 chronic kidney disease, or unspecified chronic kidney disease: Secondary | ICD-10-CM | POA: Diagnosis not present

## 2016-12-25 DIAGNOSIS — N2581 Secondary hyperparathyroidism of renal origin: Secondary | ICD-10-CM | POA: Diagnosis not present

## 2016-12-25 DIAGNOSIS — D631 Anemia in chronic kidney disease: Secondary | ICD-10-CM | POA: Diagnosis not present

## 2017-01-04 ENCOUNTER — Other Ambulatory Visit: Payer: Self-pay

## 2017-01-08 DIAGNOSIS — I517 Cardiomegaly: Secondary | ICD-10-CM | POA: Diagnosis not present

## 2017-01-08 DIAGNOSIS — B029 Zoster without complications: Secondary | ICD-10-CM | POA: Diagnosis not present

## 2017-01-08 DIAGNOSIS — R0789 Other chest pain: Secondary | ICD-10-CM | POA: Diagnosis not present

## 2017-01-30 DIAGNOSIS — R531 Weakness: Secondary | ICD-10-CM | POA: Diagnosis not present

## 2017-01-30 DIAGNOSIS — B0229 Other postherpetic nervous system involvement: Secondary | ICD-10-CM | POA: Diagnosis not present

## 2017-01-30 DIAGNOSIS — B029 Zoster without complications: Secondary | ICD-10-CM | POA: Diagnosis not present

## 2017-02-04 DIAGNOSIS — J439 Emphysema, unspecified: Secondary | ICD-10-CM | POA: Diagnosis not present

## 2017-02-04 DIAGNOSIS — I509 Heart failure, unspecified: Secondary | ICD-10-CM | POA: Diagnosis not present

## 2017-02-04 DIAGNOSIS — Z7901 Long term (current) use of anticoagulants: Secondary | ICD-10-CM | POA: Diagnosis not present

## 2017-02-04 DIAGNOSIS — E7800 Pure hypercholesterolemia, unspecified: Secondary | ICD-10-CM | POA: Diagnosis not present

## 2017-02-04 DIAGNOSIS — G8929 Other chronic pain: Secondary | ICD-10-CM | POA: Diagnosis not present

## 2017-02-04 DIAGNOSIS — B029 Zoster without complications: Secondary | ICD-10-CM | POA: Diagnosis not present

## 2017-02-04 DIAGNOSIS — I130 Hypertensive heart and chronic kidney disease with heart failure and stage 1 through stage 4 chronic kidney disease, or unspecified chronic kidney disease: Secondary | ICD-10-CM | POA: Diagnosis not present

## 2017-02-04 DIAGNOSIS — I2510 Atherosclerotic heart disease of native coronary artery without angina pectoris: Secondary | ICD-10-CM | POA: Diagnosis not present

## 2017-02-04 DIAGNOSIS — I4891 Unspecified atrial fibrillation: Secondary | ICD-10-CM | POA: Diagnosis not present

## 2017-02-04 DIAGNOSIS — F329 Major depressive disorder, single episode, unspecified: Secondary | ICD-10-CM | POA: Diagnosis not present

## 2017-02-04 DIAGNOSIS — Z9981 Dependence on supplemental oxygen: Secondary | ICD-10-CM | POA: Diagnosis not present

## 2017-02-04 DIAGNOSIS — N189 Chronic kidney disease, unspecified: Secondary | ICD-10-CM | POA: Diagnosis not present

## 2017-02-07 DIAGNOSIS — B029 Zoster without complications: Secondary | ICD-10-CM | POA: Diagnosis not present

## 2017-02-07 DIAGNOSIS — I2510 Atherosclerotic heart disease of native coronary artery without angina pectoris: Secondary | ICD-10-CM | POA: Diagnosis not present

## 2017-02-07 DIAGNOSIS — I509 Heart failure, unspecified: Secondary | ICD-10-CM | POA: Diagnosis not present

## 2017-02-07 DIAGNOSIS — N189 Chronic kidney disease, unspecified: Secondary | ICD-10-CM | POA: Diagnosis not present

## 2017-02-07 DIAGNOSIS — I4891 Unspecified atrial fibrillation: Secondary | ICD-10-CM | POA: Diagnosis not present

## 2017-02-07 DIAGNOSIS — I130 Hypertensive heart and chronic kidney disease with heart failure and stage 1 through stage 4 chronic kidney disease, or unspecified chronic kidney disease: Secondary | ICD-10-CM | POA: Diagnosis not present

## 2017-02-12 DIAGNOSIS — I130 Hypertensive heart and chronic kidney disease with heart failure and stage 1 through stage 4 chronic kidney disease, or unspecified chronic kidney disease: Secondary | ICD-10-CM | POA: Diagnosis not present

## 2017-02-12 DIAGNOSIS — I2510 Atherosclerotic heart disease of native coronary artery without angina pectoris: Secondary | ICD-10-CM | POA: Diagnosis not present

## 2017-02-12 DIAGNOSIS — I509 Heart failure, unspecified: Secondary | ICD-10-CM | POA: Diagnosis not present

## 2017-02-12 DIAGNOSIS — I4891 Unspecified atrial fibrillation: Secondary | ICD-10-CM | POA: Diagnosis not present

## 2017-02-12 DIAGNOSIS — N189 Chronic kidney disease, unspecified: Secondary | ICD-10-CM | POA: Diagnosis not present

## 2017-02-12 DIAGNOSIS — B029 Zoster without complications: Secondary | ICD-10-CM | POA: Diagnosis not present

## 2017-02-15 DIAGNOSIS — G894 Chronic pain syndrome: Secondary | ICD-10-CM | POA: Diagnosis not present

## 2017-02-15 DIAGNOSIS — E538 Deficiency of other specified B group vitamins: Secondary | ICD-10-CM | POA: Diagnosis not present

## 2017-02-15 DIAGNOSIS — B0229 Other postherpetic nervous system involvement: Secondary | ICD-10-CM | POA: Diagnosis not present

## 2017-02-21 DIAGNOSIS — N189 Chronic kidney disease, unspecified: Secondary | ICD-10-CM | POA: Diagnosis not present

## 2017-02-21 DIAGNOSIS — I2510 Atherosclerotic heart disease of native coronary artery without angina pectoris: Secondary | ICD-10-CM | POA: Diagnosis not present

## 2017-02-21 DIAGNOSIS — B029 Zoster without complications: Secondary | ICD-10-CM | POA: Diagnosis not present

## 2017-02-21 DIAGNOSIS — I130 Hypertensive heart and chronic kidney disease with heart failure and stage 1 through stage 4 chronic kidney disease, or unspecified chronic kidney disease: Secondary | ICD-10-CM | POA: Diagnosis not present

## 2017-02-21 DIAGNOSIS — I4891 Unspecified atrial fibrillation: Secondary | ICD-10-CM | POA: Diagnosis not present

## 2017-02-21 DIAGNOSIS — I509 Heart failure, unspecified: Secondary | ICD-10-CM | POA: Diagnosis not present

## 2017-02-28 DIAGNOSIS — I509 Heart failure, unspecified: Secondary | ICD-10-CM | POA: Diagnosis not present

## 2017-02-28 DIAGNOSIS — B029 Zoster without complications: Secondary | ICD-10-CM | POA: Diagnosis not present

## 2017-02-28 DIAGNOSIS — I130 Hypertensive heart and chronic kidney disease with heart failure and stage 1 through stage 4 chronic kidney disease, or unspecified chronic kidney disease: Secondary | ICD-10-CM | POA: Diagnosis not present

## 2017-02-28 DIAGNOSIS — I2510 Atherosclerotic heart disease of native coronary artery without angina pectoris: Secondary | ICD-10-CM | POA: Diagnosis not present

## 2017-02-28 DIAGNOSIS — I4891 Unspecified atrial fibrillation: Secondary | ICD-10-CM | POA: Diagnosis not present

## 2017-02-28 DIAGNOSIS — N189 Chronic kidney disease, unspecified: Secondary | ICD-10-CM | POA: Diagnosis not present

## 2017-03-08 DIAGNOSIS — I4891 Unspecified atrial fibrillation: Secondary | ICD-10-CM | POA: Diagnosis not present

## 2017-03-08 DIAGNOSIS — I130 Hypertensive heart and chronic kidney disease with heart failure and stage 1 through stage 4 chronic kidney disease, or unspecified chronic kidney disease: Secondary | ICD-10-CM | POA: Diagnosis not present

## 2017-03-08 DIAGNOSIS — I509 Heart failure, unspecified: Secondary | ICD-10-CM | POA: Diagnosis not present

## 2017-03-08 DIAGNOSIS — I2510 Atherosclerotic heart disease of native coronary artery without angina pectoris: Secondary | ICD-10-CM | POA: Diagnosis not present

## 2017-03-08 DIAGNOSIS — N189 Chronic kidney disease, unspecified: Secondary | ICD-10-CM | POA: Diagnosis not present

## 2017-03-08 DIAGNOSIS — B029 Zoster without complications: Secondary | ICD-10-CM | POA: Diagnosis not present

## 2017-03-15 ENCOUNTER — Ambulatory Visit: Payer: Medicare Other | Admitting: Sports Medicine

## 2017-03-22 DIAGNOSIS — I10 Essential (primary) hypertension: Secondary | ICD-10-CM | POA: Diagnosis not present

## 2017-03-22 DIAGNOSIS — E785 Hyperlipidemia, unspecified: Secondary | ICD-10-CM | POA: Diagnosis not present

## 2017-03-22 DIAGNOSIS — N184 Chronic kidney disease, stage 4 (severe): Secondary | ICD-10-CM | POA: Diagnosis not present

## 2017-03-22 DIAGNOSIS — E538 Deficiency of other specified B group vitamins: Secondary | ICD-10-CM | POA: Diagnosis not present

## 2017-03-22 DIAGNOSIS — M810 Age-related osteoporosis without current pathological fracture: Secondary | ICD-10-CM | POA: Diagnosis not present

## 2017-03-22 DIAGNOSIS — J449 Chronic obstructive pulmonary disease, unspecified: Secondary | ICD-10-CM | POA: Diagnosis not present

## 2017-03-22 DIAGNOSIS — I2510 Atherosclerotic heart disease of native coronary artery without angina pectoris: Secondary | ICD-10-CM | POA: Diagnosis not present

## 2017-03-22 DIAGNOSIS — G8929 Other chronic pain: Secondary | ICD-10-CM | POA: Diagnosis not present

## 2017-03-22 DIAGNOSIS — I4891 Unspecified atrial fibrillation: Secondary | ICD-10-CM | POA: Diagnosis not present

## 2017-03-22 DIAGNOSIS — Z23 Encounter for immunization: Secondary | ICD-10-CM | POA: Diagnosis not present

## 2017-03-22 DIAGNOSIS — Z6820 Body mass index (BMI) 20.0-20.9, adult: Secondary | ICD-10-CM | POA: Diagnosis not present

## 2017-03-22 DIAGNOSIS — B0229 Other postherpetic nervous system involvement: Secondary | ICD-10-CM | POA: Diagnosis not present

## 2017-05-13 DIAGNOSIS — I509 Heart failure, unspecified: Secondary | ICD-10-CM | POA: Diagnosis not present

## 2017-05-13 DIAGNOSIS — Z7901 Long term (current) use of anticoagulants: Secondary | ICD-10-CM | POA: Diagnosis not present

## 2017-05-13 DIAGNOSIS — S79922A Unspecified injury of left thigh, initial encounter: Secondary | ICD-10-CM | POA: Diagnosis not present

## 2017-05-13 DIAGNOSIS — S0990XA Unspecified injury of head, initial encounter: Secondary | ICD-10-CM | POA: Diagnosis not present

## 2017-05-13 DIAGNOSIS — I4891 Unspecified atrial fibrillation: Secondary | ICD-10-CM | POA: Diagnosis not present

## 2017-05-13 DIAGNOSIS — G8929 Other chronic pain: Secondary | ICD-10-CM | POA: Diagnosis not present

## 2017-05-13 DIAGNOSIS — I110 Hypertensive heart disease with heart failure: Secondary | ICD-10-CM | POA: Diagnosis not present

## 2017-05-13 DIAGNOSIS — M25552 Pain in left hip: Secondary | ICD-10-CM | POA: Diagnosis not present

## 2017-05-13 DIAGNOSIS — M549 Dorsalgia, unspecified: Secondary | ICD-10-CM | POA: Diagnosis not present

## 2017-05-13 DIAGNOSIS — Z955 Presence of coronary angioplasty implant and graft: Secondary | ICD-10-CM | POA: Diagnosis not present

## 2017-05-13 DIAGNOSIS — M79662 Pain in left lower leg: Secondary | ICD-10-CM | POA: Diagnosis not present

## 2017-05-13 DIAGNOSIS — S76312A Strain of muscle, fascia and tendon of the posterior muscle group at thigh level, left thigh, initial encounter: Secondary | ICD-10-CM | POA: Diagnosis not present

## 2017-05-13 DIAGNOSIS — S3993XA Unspecified injury of pelvis, initial encounter: Secondary | ICD-10-CM | POA: Diagnosis not present

## 2017-05-13 DIAGNOSIS — Z79891 Long term (current) use of opiate analgesic: Secondary | ICD-10-CM | POA: Diagnosis not present

## 2017-05-13 DIAGNOSIS — I2510 Atherosclerotic heart disease of native coronary artery without angina pectoris: Secondary | ICD-10-CM | POA: Diagnosis not present

## 2017-05-13 DIAGNOSIS — S99912A Unspecified injury of left ankle, initial encounter: Secondary | ICD-10-CM | POA: Diagnosis not present

## 2017-05-13 DIAGNOSIS — S8992XA Unspecified injury of left lower leg, initial encounter: Secondary | ICD-10-CM | POA: Diagnosis not present

## 2017-05-13 DIAGNOSIS — S76912A Strain of unspecified muscles, fascia and tendons at thigh level, left thigh, initial encounter: Secondary | ICD-10-CM | POA: Diagnosis not present

## 2017-05-13 DIAGNOSIS — S99922A Unspecified injury of left foot, initial encounter: Secondary | ICD-10-CM | POA: Diagnosis not present

## 2017-05-30 DIAGNOSIS — I129 Hypertensive chronic kidney disease with stage 1 through stage 4 chronic kidney disease, or unspecified chronic kidney disease: Secondary | ICD-10-CM | POA: Diagnosis not present

## 2017-05-30 DIAGNOSIS — D631 Anemia in chronic kidney disease: Secondary | ICD-10-CM | POA: Diagnosis not present

## 2017-05-30 DIAGNOSIS — N184 Chronic kidney disease, stage 4 (severe): Secondary | ICD-10-CM | POA: Diagnosis not present

## 2017-05-30 DIAGNOSIS — N2581 Secondary hyperparathyroidism of renal origin: Secondary | ICD-10-CM | POA: Diagnosis not present

## 2017-05-30 DIAGNOSIS — N189 Chronic kidney disease, unspecified: Secondary | ICD-10-CM | POA: Diagnosis not present

## 2017-06-17 DIAGNOSIS — E538 Deficiency of other specified B group vitamins: Secondary | ICD-10-CM | POA: Diagnosis not present

## 2017-06-17 DIAGNOSIS — Z5181 Encounter for therapeutic drug level monitoring: Secondary | ICD-10-CM | POA: Diagnosis not present

## 2017-06-17 DIAGNOSIS — G894 Chronic pain syndrome: Secondary | ICD-10-CM | POA: Diagnosis not present

## 2017-06-17 DIAGNOSIS — Z79899 Other long term (current) drug therapy: Secondary | ICD-10-CM | POA: Diagnosis not present

## 2017-07-22 DIAGNOSIS — E538 Deficiency of other specified B group vitamins: Secondary | ICD-10-CM | POA: Diagnosis not present

## 2017-07-22 DIAGNOSIS — Z79899 Other long term (current) drug therapy: Secondary | ICD-10-CM | POA: Diagnosis not present

## 2017-07-22 DIAGNOSIS — Z5181 Encounter for therapeutic drug level monitoring: Secondary | ICD-10-CM | POA: Diagnosis not present

## 2017-07-22 DIAGNOSIS — M6530 Trigger finger, unspecified finger: Secondary | ICD-10-CM | POA: Diagnosis not present

## 2017-08-20 DIAGNOSIS — G894 Chronic pain syndrome: Secondary | ICD-10-CM | POA: Diagnosis not present

## 2017-08-20 DIAGNOSIS — E539 Vitamin B deficiency, unspecified: Secondary | ICD-10-CM | POA: Diagnosis not present

## 2017-08-20 DIAGNOSIS — Z79899 Other long term (current) drug therapy: Secondary | ICD-10-CM | POA: Diagnosis not present

## 2017-08-20 DIAGNOSIS — Z5181 Encounter for therapeutic drug level monitoring: Secondary | ICD-10-CM | POA: Diagnosis not present

## 2017-09-04 DIAGNOSIS — M6281 Muscle weakness (generalized): Secondary | ICD-10-CM | POA: Diagnosis not present

## 2017-09-04 DIAGNOSIS — M6530 Trigger finger, unspecified finger: Secondary | ICD-10-CM | POA: Diagnosis not present

## 2017-09-04 DIAGNOSIS — M25442 Effusion, left hand: Secondary | ICD-10-CM | POA: Diagnosis not present

## 2017-09-04 DIAGNOSIS — M79642 Pain in left hand: Secondary | ICD-10-CM | POA: Diagnosis not present

## 2017-09-12 DIAGNOSIS — M6530 Trigger finger, unspecified finger: Secondary | ICD-10-CM | POA: Diagnosis not present

## 2017-09-12 DIAGNOSIS — M79642 Pain in left hand: Secondary | ICD-10-CM | POA: Diagnosis not present

## 2017-09-12 DIAGNOSIS — M25442 Effusion, left hand: Secondary | ICD-10-CM | POA: Diagnosis not present

## 2017-09-12 DIAGNOSIS — M6281 Muscle weakness (generalized): Secondary | ICD-10-CM | POA: Diagnosis not present

## 2017-09-20 DIAGNOSIS — M6530 Trigger finger, unspecified finger: Secondary | ICD-10-CM | POA: Diagnosis not present

## 2017-09-20 DIAGNOSIS — E539 Vitamin B deficiency, unspecified: Secondary | ICD-10-CM | POA: Diagnosis not present

## 2017-09-20 DIAGNOSIS — Z5181 Encounter for therapeutic drug level monitoring: Secondary | ICD-10-CM | POA: Diagnosis not present

## 2017-09-20 DIAGNOSIS — Z79899 Other long term (current) drug therapy: Secondary | ICD-10-CM | POA: Diagnosis not present

## 2017-09-20 DIAGNOSIS — G8929 Other chronic pain: Secondary | ICD-10-CM | POA: Diagnosis not present

## 2017-10-16 DIAGNOSIS — E7800 Pure hypercholesterolemia, unspecified: Secondary | ICD-10-CM | POA: Diagnosis not present

## 2017-10-16 DIAGNOSIS — G8929 Other chronic pain: Secondary | ICD-10-CM | POA: Diagnosis not present

## 2017-10-16 DIAGNOSIS — Z5181 Encounter for therapeutic drug level monitoring: Secondary | ICD-10-CM | POA: Diagnosis not present

## 2017-10-16 DIAGNOSIS — Z79899 Other long term (current) drug therapy: Secondary | ICD-10-CM | POA: Diagnosis not present

## 2017-11-04 DIAGNOSIS — N184 Chronic kidney disease, stage 4 (severe): Secondary | ICD-10-CM | POA: Diagnosis not present

## 2017-11-04 DIAGNOSIS — D631 Anemia in chronic kidney disease: Secondary | ICD-10-CM | POA: Diagnosis not present

## 2017-11-04 DIAGNOSIS — I129 Hypertensive chronic kidney disease with stage 1 through stage 4 chronic kidney disease, or unspecified chronic kidney disease: Secondary | ICD-10-CM | POA: Diagnosis not present

## 2017-11-04 DIAGNOSIS — N2581 Secondary hyperparathyroidism of renal origin: Secondary | ICD-10-CM | POA: Diagnosis not present

## 2017-11-07 DIAGNOSIS — J310 Chronic rhinitis: Secondary | ICD-10-CM | POA: Diagnosis not present

## 2017-11-07 DIAGNOSIS — G4733 Obstructive sleep apnea (adult) (pediatric): Secondary | ICD-10-CM | POA: Diagnosis not present

## 2017-11-07 DIAGNOSIS — J449 Chronic obstructive pulmonary disease, unspecified: Secondary | ICD-10-CM | POA: Diagnosis not present

## 2017-11-08 DIAGNOSIS — N2581 Secondary hyperparathyroidism of renal origin: Secondary | ICD-10-CM | POA: Diagnosis not present

## 2017-11-08 DIAGNOSIS — J449 Chronic obstructive pulmonary disease, unspecified: Secondary | ICD-10-CM | POA: Diagnosis not present

## 2017-11-08 DIAGNOSIS — N184 Chronic kidney disease, stage 4 (severe): Secondary | ICD-10-CM | POA: Diagnosis not present

## 2017-11-08 DIAGNOSIS — N189 Chronic kidney disease, unspecified: Secondary | ICD-10-CM | POA: Diagnosis not present

## 2017-11-29 DIAGNOSIS — G894 Chronic pain syndrome: Secondary | ICD-10-CM | POA: Diagnosis not present

## 2017-11-29 DIAGNOSIS — E785 Hyperlipidemia, unspecified: Secondary | ICD-10-CM | POA: Diagnosis not present

## 2017-11-29 DIAGNOSIS — N390 Urinary tract infection, site not specified: Secondary | ICD-10-CM | POA: Diagnosis not present

## 2017-11-29 DIAGNOSIS — E7800 Pure hypercholesterolemia, unspecified: Secondary | ICD-10-CM | POA: Diagnosis not present

## 2017-11-29 DIAGNOSIS — E539 Vitamin B deficiency, unspecified: Secondary | ICD-10-CM | POA: Diagnosis not present

## 2017-11-29 DIAGNOSIS — Z5181 Encounter for therapeutic drug level monitoring: Secondary | ICD-10-CM | POA: Diagnosis not present

## 2017-11-29 DIAGNOSIS — Z79899 Other long term (current) drug therapy: Secondary | ICD-10-CM | POA: Diagnosis not present

## 2017-12-22 NOTE — Progress Notes (Signed)
Cardiology Office Note:    Date:  12/23/2017   ID:  Tiffany Grant, DOB 18-Aug-1930, MRN [id number]  PCP:  Garwin Brothers, MD  Cardiologist:  Shirlee More, MD    Referring MD: No ref. provider found    ASSESSMENT:    1. Chronic a-fib ([place])   2. Chronic anticoagulation   3. Chronic diastolic heart failure ([place])   4. Hypertensive heart and kidney disease with chronic diastolic congestive heart failure and stage 4 chronic kidney disease ([place])    PLAN:    In order of problems listed above:  1. Stable rate controlled with combination of long-acting calcium channel blocker low-dose beta-blocker and reduced dose anticoagulant. 2. Stable continue current reduced dose anticoagulant 3. Stable compensated she has no fluid overload New York Heart Association class II diastolic failure and continue current diuretic and antihypertensives. 4. Stable continue reduced dose anticoagulant await labs requested from her PCP office   Next appointment: 6 months   Medication Adjustments/Labs and Tests Ordered: Current medicines are reviewed at length with the patient today.  Concerns regarding medicines are outlined above.  No orders of the defined types were placed in this encounter.  No orders of the defined types were placed in this encounter.   Chief Complaint  Patient presents with  . Congestive Heart Failure  . Atrial Fibrillation  . Hypertension  . Chronic Kidney Disease    History of Present Illness:    Tiffany Grant is a 84 y.o. female with a hx of chronic AF, heart failure, hypertension and CKD last seen 01/05/16 at [facility] cardiology.. Compliance with diet, lifestyle and medications: 6 months  She is pleased with the quality of her life and except for hot humid weather outdoors has had no shortness of breath edema orthopnea palpitations chest pain bleeding complication of her anticoagulant or TIA.  Most recent labs requested from her PCP Past Medical History:  Diagnosis Date  . Chronic  a-fib ([place]) 05/04/2015  . Chronic anticoagulation 05/04/2015  . Chronic diastolic heart failure ([place]) 05/04/2015  . CKD (chronic kidney disease) stage 4, GFR 15-29 ml/min (HCC) 05/04/2015  . Hypertensive heart disease with congestive heart failure and chronic kidney disease ([place]) 05/04/2015    Past Surgical History:  Procedure Laterality Date  . RENAL ARTERY STENT      Current Medications: Current Meds  Medication Sig  . calcitRIOL (ROCALTROL) 1 mcg/mL SOLN Take 0.04 mcg/kg by mouth daily.   Marland Kitchen diltiazem (CARDIZEM CD) 240 MG 24 hr capsule Take 240 mg by mouth daily.   . fentaNYL (DURAGESIC - DOSED MCG/HR) 25 MCG/HR patch Place onto the skin every 3 (three) days.   Lyndle Herrlich SULFATE PO Take 325 mg by mouth daily.   . furosemide (LASIX) 40 MG tablet Take 40 mg by mouth 2 (two) times daily.   . hydrALAZINE (APRESOLINE) 25 MG tablet TAKE ONE TABLET BY MOUTH THREE TIMES DAILY  . HYDROcodone-acetaminophen (NORCO/VICODIN) 5-325 MG tablet Take 1 tablet by mouth 3 (three) times daily as needed. for pain  . isosorbide dinitrate (ISORDIL) 10 MG tablet TAKE ONE TABLET BY MOUTH THREE TIMES DAILY  . metoprolol succinate (TOPROL-XL) 50 MG 24 hr tablet Take 50 mg by mouth 2 (two) times daily.   . Multiple Vitamin (MULTI-VITAMINS) TABS Take 1 tablet by mouth daily.   . potassium chloride (KLOR-CON) 8 MEQ tablet Take 8 mEq by mouth daily.   . Rivaroxaban (XARELTO) 15 MG TABS tablet Take 15 mg by mouth daily with supper.   Marland Kitchen  rosuvastatin (CRESTOR) 20 MG tablet Take 20 mg by mouth daily.      Allergies:   Carbamazepine; Clonidine; and Iodinated diagnostic agents   Social History   Socioeconomic History  . Marital status: Widowed    Spouse name: Not on file  . Number of children: Not on file  . Years of education: Not on file  . Highest education level: Not on file  Occupational History  . Not on file  Social Needs  . Financial resource strain: Not on file  . Food insecurity:    Worry: Not on  file    Inability: Not on file  . Transportation needs:    Medical: Not on file    Non-medical: Not on file  Tobacco Use  . Smoking status: Former Smoker    Last attempt to quit: 05/04/2000    Years since quitting: 17.6  . Smokeless tobacco: Never Used  Substance and Sexual Activity  . Alcohol use: Not on file  . Drug use: Not on file  . Sexual activity: Not on file  Lifestyle  . Physical activity:    Days per week: Not on file    Minutes per session: Not on file  . Stress: Not on file  Relationships  . Social connections:    Talks on phone: Not on file    Gets together: Not on file    Attends religious service: Not on file    Active member of club or organization: Not on file    Attends meetings of clubs or organizations: Not on file    Relationship status: Not on file  Other Topics Concern  . Not on file  Social History Narrative  . Not on file     Family History: The patient's family history includes Congestive Heart Failure in her father and mother; Diabetes in her sister; Heart disease in her maternal grandfather, maternal grandmother, paternal grandfather, and paternal grandmother; Hypertension in her mother. ROS:   Please see the history of present illness.    All other systems reviewed and are negative.  EKGs/Labs/Other Studies Reviewed:    The following studies were reviewed today:  EKG:  EKG ordered today.  The ekg ordered today demonstrates AF controlled rate consider anterior MI  Recent Labs: No results found for requested labs within last 8760 hours.  Recent Lipid Panel No results found for: CHOL, TRIG, HDL, CHOLHDL, VLDL, LDLCALC, LDLDIRECT  Physical Exam:    VS:  BP 122/70 (BP Location: Right Arm, Patient Position: Sitting, Cuff Size: Normal)   Pulse 81   Ht 5\' 5"  (1.651 m)   Wt 114 lb (51.7 kg)   SpO2 94%   BMI 18.97 kg/m     Wt Readings from Last 3 Encounters:  12/23/17 114 lb (51.7 kg)     GEN: frail  but in no acute distress HEENT:  Normal NECK: No JVD; No carotid bruits LYMPHATICS: No lymphadenopathy CARDIAC: Irr Irr variable s1no murmurs, rubs, gallops RESPIRATORY:  Clear to auscultation without rales, wheezing or rhonchi  ABDOMEN: Soft, non-tender, non-distended MUSCULOSKELETAL:  No edema; No deformity  SKIN: Warm and dry NEUROLOGIC:  Alert and oriented x 3 PSYCHIATRIC:  Normal affect    Signed, Shirlee More, MD  12/23/2017 3:59 PM    [facility]

## 2017-12-23 ENCOUNTER — Encounter: Payer: Self-pay | Admitting: Cardiology

## 2017-12-23 ENCOUNTER — Ambulatory Visit (INDEPENDENT_AMBULATORY_CARE_PROVIDER_SITE_OTHER): Payer: Medicare Other | Admitting: Cardiology

## 2017-12-23 VITALS — BP 122/70 | HR 81 | Ht 65.0 in | Wt 114.0 lb

## 2017-12-23 DIAGNOSIS — I482 Chronic atrial fibrillation, unspecified: Secondary | ICD-10-CM

## 2017-12-23 DIAGNOSIS — N184 Chronic kidney disease, stage 4 (severe): Secondary | ICD-10-CM | POA: Diagnosis not present

## 2017-12-23 DIAGNOSIS — I5032 Chronic diastolic (congestive) heart failure: Secondary | ICD-10-CM

## 2017-12-23 DIAGNOSIS — Z7901 Long term (current) use of anticoagulants: Secondary | ICD-10-CM

## 2017-12-23 DIAGNOSIS — I130 Hypertensive heart and chronic kidney disease with heart failure and stage 1 through stage 4 chronic kidney disease, or unspecified chronic kidney disease: Secondary | ICD-10-CM

## 2017-12-23 NOTE — Patient Instructions (Signed)
Medication Instructions:  Your physician recommends that you continue on your current medications as directed. Please refer to the Current Medication list given to you today.   Labwork: NONE  Testing/Procedures: You had an EKG today  Follow-Up: Your physician wants you to follow-up in: 6 months. You will receive a reminder letter in the mail two months in advance. If you don't receive a letter, please call our office to schedule the follow-up appointment.   Any Other Special Instructions Will Be Listed Below (If Applicable).     If you need a refill on your cardiac medications before your next appointment, please call your pharmacy.

## 2017-12-24 NOTE — Addendum Note (Signed)
Addended by: Mattie Marlin on: 12/24/2017 08:12 AM   Modules accepted: Orders

## 2017-12-27 DIAGNOSIS — E538 Deficiency of other specified B group vitamins: Secondary | ICD-10-CM | POA: Diagnosis not present

## 2017-12-27 DIAGNOSIS — Z5181 Encounter for therapeutic drug level monitoring: Secondary | ICD-10-CM | POA: Diagnosis not present

## 2017-12-27 DIAGNOSIS — G894 Chronic pain syndrome: Secondary | ICD-10-CM | POA: Diagnosis not present

## 2017-12-27 DIAGNOSIS — Z79899 Other long term (current) drug therapy: Secondary | ICD-10-CM | POA: Diagnosis not present

## 2018-01-24 DIAGNOSIS — E538 Deficiency of other specified B group vitamins: Secondary | ICD-10-CM | POA: Diagnosis not present

## 2018-01-24 DIAGNOSIS — Z5181 Encounter for therapeutic drug level monitoring: Secondary | ICD-10-CM | POA: Diagnosis not present

## 2018-01-24 DIAGNOSIS — G894 Chronic pain syndrome: Secondary | ICD-10-CM | POA: Diagnosis not present

## 2018-01-24 DIAGNOSIS — J449 Chronic obstructive pulmonary disease, unspecified: Secondary | ICD-10-CM | POA: Diagnosis not present

## 2018-01-24 DIAGNOSIS — Z79899 Other long term (current) drug therapy: Secondary | ICD-10-CM | POA: Diagnosis not present

## 2018-02-18 DIAGNOSIS — J310 Chronic rhinitis: Secondary | ICD-10-CM | POA: Diagnosis not present

## 2018-02-18 DIAGNOSIS — G4733 Obstructive sleep apnea (adult) (pediatric): Secondary | ICD-10-CM | POA: Diagnosis not present

## 2018-02-18 DIAGNOSIS — J449 Chronic obstructive pulmonary disease, unspecified: Secondary | ICD-10-CM | POA: Diagnosis not present

## 2018-02-21 DIAGNOSIS — Z79899 Other long term (current) drug therapy: Secondary | ICD-10-CM | POA: Diagnosis not present

## 2018-02-21 DIAGNOSIS — Z5181 Encounter for therapeutic drug level monitoring: Secondary | ICD-10-CM | POA: Diagnosis not present

## 2018-02-21 DIAGNOSIS — Z23 Encounter for immunization: Secondary | ICD-10-CM | POA: Diagnosis not present

## 2018-02-21 DIAGNOSIS — G894 Chronic pain syndrome: Secondary | ICD-10-CM | POA: Diagnosis not present

## 2018-03-19 ENCOUNTER — Encounter: Payer: Self-pay | Admitting: Sports Medicine

## 2018-03-19 ENCOUNTER — Ambulatory Visit (INDEPENDENT_AMBULATORY_CARE_PROVIDER_SITE_OTHER): Payer: Medicare Other | Admitting: Sports Medicine

## 2018-03-19 VITALS — BP 130/85 | HR 80 | Resp 16

## 2018-03-19 DIAGNOSIS — M79609 Pain in unspecified limb: Principal | ICD-10-CM

## 2018-03-19 DIAGNOSIS — M79676 Pain in unspecified toe(s): Secondary | ICD-10-CM

## 2018-03-19 DIAGNOSIS — B351 Tinea unguium: Secondary | ICD-10-CM

## 2018-03-19 DIAGNOSIS — I739 Peripheral vascular disease, unspecified: Secondary | ICD-10-CM

## 2018-03-19 NOTE — Progress Notes (Signed)
Subjective: Tiffany Grant is a 84 y.o. female patient seen today in office with complaint of painful thickened and elongated toenails; unable to trim. Patient denies any changes with medical history or medications since last visit. Still on Xarelto for stents.  No other issues noted.  Denies other pedal complaints at this time.   Patient Active Problem List   Diagnosis Date Noted  . Chronic a-fib 05/04/2015  . Chronic anticoagulation 05/04/2015  . Chronic diastolic heart failure ([place]) 05/04/2015  . CKD (chronic kidney disease) stage 4, GFR 15-29 ml/min (HCC) 05/04/2015  . Hypertensive heart disease with congestive heart failure and chronic kidney disease ([place]) 05/04/2015    Current Outpatient Medications on File Prior to Visit  Medication Sig Dispense Refill  . calcitRIOL (ROCALTROL) 1 mcg/mL SOLN Take 0.04 mcg/kg by mouth daily.     Marland Kitchen diltiazem (CARDIZEM CD) 240 MG 24 hr capsule Take 240 mg by mouth daily.     . fentaNYL (DURAGESIC - DOSED MCG/HR) 25 MCG/HR patch Place onto the skin every 3 (three) days.     Lyndle Herrlich SULFATE PO Take 325 mg by mouth daily.     . furosemide (LASIX) 40 MG tablet Take 40 mg by mouth 2 (two) times daily.     . hydrALAZINE (APRESOLINE) 25 MG tablet TAKE ONE TABLET BY MOUTH THREE TIMES DAILY    . HYDROcodone-acetaminophen (NORCO/VICODIN) 5-325 MG tablet Take 1 tablet by mouth 3 (three) times daily as needed. for pain  0  . isosorbide dinitrate (ISORDIL) 10 MG tablet TAKE ONE TABLET BY MOUTH THREE TIMES DAILY    . metoprolol succinate (TOPROL-XL) 50 MG 24 hr tablet Take 50 mg by mouth 2 (two) times daily.     . Multiple Vitamin (MULTI-VITAMINS) TABS Take 1 tablet by mouth daily.     . potassium chloride (KLOR-CON) 8 MEQ tablet Take 8 mEq by mouth daily.     . Rivaroxaban (XARELTO) 15 MG TABS tablet Take 15 mg by mouth daily with supper.     . rosuvastatin (CRESTOR) 20 MG tablet Take 20 mg by mouth daily.      No current facility-administered medications on file  prior to visit.     Allergies  Allergen Reactions  . Carbamazepine     Unknown  . Clonidine     Unknown  . Iodinated Diagnostic Agents     Unknown Unknown    Objective: Physical Exam  General: Well developed, nourished, no acute distress, awake, alert and oriented x 3  Vascular: Dorsalis pedis artery 1/4 bilateral, Posterior tibial artery 0/4 bilateral due to trace edema bilateral ankles, skin temperature warm to warm proximal to distal bilateral lower extremities, + varicosities, no pedal hair present bilateral.  Neurological: Gross sensation present via light touch bilateral.   Dermatological: Skin is warm, dry, and supple bilateral, Nails 1-10 are tender, long, thick, and discolored with moerate subungal debris most involved is 1st toenails bilateral, no webspace macerations present bilateral, no open lesions present bilateral, no callus/corns/hyperkeratotic tissue present bilateral. No signs of infection bilateral.  Musculoskeletal: No symptomatic boney deformities noted bilateral. Muscular strength within normal limits without painon range of motion. No pain with calf compression bilateral.  Assessment and Plan:  Problem List Items Addressed This Visit    None    Visit Diagnoses    Pain due to onychomycosis of nail    -  Primary   PVD (peripheral vascular disease) ([place])          -Examined patient.  -  Discussed treatment options for painful mycotic nails. -Mechanically debrided and reduced mycotic nails with sterile nail nipper and dremel nail file without incident. -Patient to return in 3 months for follow up evaluation or sooner if symptoms worsen.  Landis Martins, DPM

## 2018-03-24 DIAGNOSIS — G894 Chronic pain syndrome: Secondary | ICD-10-CM | POA: Diagnosis not present

## 2018-03-24 DIAGNOSIS — Z5181 Encounter for therapeutic drug level monitoring: Secondary | ICD-10-CM | POA: Diagnosis not present

## 2018-03-24 DIAGNOSIS — E538 Deficiency of other specified B group vitamins: Secondary | ICD-10-CM | POA: Diagnosis not present

## 2018-03-24 DIAGNOSIS — J449 Chronic obstructive pulmonary disease, unspecified: Secondary | ICD-10-CM | POA: Diagnosis not present

## 2018-03-24 DIAGNOSIS — Z79899 Other long term (current) drug therapy: Secondary | ICD-10-CM | POA: Diagnosis not present

## 2018-03-27 DIAGNOSIS — N189 Chronic kidney disease, unspecified: Secondary | ICD-10-CM | POA: Diagnosis not present

## 2018-03-27 DIAGNOSIS — N2581 Secondary hyperparathyroidism of renal origin: Secondary | ICD-10-CM | POA: Diagnosis not present

## 2018-03-27 DIAGNOSIS — N184 Chronic kidney disease, stage 4 (severe): Secondary | ICD-10-CM | POA: Diagnosis not present

## 2018-03-31 DIAGNOSIS — D631 Anemia in chronic kidney disease: Secondary | ICD-10-CM | POA: Diagnosis not present

## 2018-03-31 DIAGNOSIS — N2581 Secondary hyperparathyroidism of renal origin: Secondary | ICD-10-CM | POA: Diagnosis not present

## 2018-03-31 DIAGNOSIS — I129 Hypertensive chronic kidney disease with stage 1 through stage 4 chronic kidney disease, or unspecified chronic kidney disease: Secondary | ICD-10-CM | POA: Diagnosis not present

## 2018-03-31 DIAGNOSIS — N184 Chronic kidney disease, stage 4 (severe): Secondary | ICD-10-CM | POA: Diagnosis not present

## 2018-04-25 DIAGNOSIS — G894 Chronic pain syndrome: Secondary | ICD-10-CM | POA: Diagnosis not present

## 2018-04-25 DIAGNOSIS — E538 Deficiency of other specified B group vitamins: Secondary | ICD-10-CM | POA: Diagnosis not present

## 2018-04-25 DIAGNOSIS — R410 Disorientation, unspecified: Secondary | ICD-10-CM | POA: Diagnosis not present

## 2018-04-25 DIAGNOSIS — Z5181 Encounter for therapeutic drug level monitoring: Secondary | ICD-10-CM | POA: Diagnosis not present

## 2018-04-25 DIAGNOSIS — J449 Chronic obstructive pulmonary disease, unspecified: Secondary | ICD-10-CM | POA: Diagnosis not present

## 2018-04-25 DIAGNOSIS — Z79899 Other long term (current) drug therapy: Secondary | ICD-10-CM | POA: Diagnosis not present

## 2018-05-26 DIAGNOSIS — G8929 Other chronic pain: Secondary | ICD-10-CM | POA: Diagnosis not present

## 2018-05-26 DIAGNOSIS — I10 Essential (primary) hypertension: Secondary | ICD-10-CM | POA: Diagnosis not present

## 2018-05-26 DIAGNOSIS — B0229 Other postherpetic nervous system involvement: Secondary | ICD-10-CM | POA: Diagnosis not present

## 2018-05-26 DIAGNOSIS — I2510 Atherosclerotic heart disease of native coronary artery without angina pectoris: Secondary | ICD-10-CM | POA: Diagnosis not present

## 2018-05-26 DIAGNOSIS — I4891 Unspecified atrial fibrillation: Secondary | ICD-10-CM | POA: Diagnosis not present

## 2018-05-26 DIAGNOSIS — E785 Hyperlipidemia, unspecified: Secondary | ICD-10-CM | POA: Diagnosis not present

## 2018-05-26 DIAGNOSIS — Z0000 Encounter for general adult medical examination without abnormal findings: Secondary | ICD-10-CM | POA: Diagnosis not present

## 2018-05-26 DIAGNOSIS — J449 Chronic obstructive pulmonary disease, unspecified: Secondary | ICD-10-CM | POA: Diagnosis not present

## 2018-05-26 DIAGNOSIS — M810 Age-related osteoporosis without current pathological fracture: Secondary | ICD-10-CM | POA: Diagnosis not present

## 2018-05-26 DIAGNOSIS — N184 Chronic kidney disease, stage 4 (severe): Secondary | ICD-10-CM | POA: Diagnosis not present

## 2018-05-26 DIAGNOSIS — R7309 Other abnormal glucose: Secondary | ICD-10-CM | POA: Diagnosis not present

## 2018-05-26 DIAGNOSIS — E538 Deficiency of other specified B group vitamins: Secondary | ICD-10-CM | POA: Diagnosis not present

## 2018-06-17 DIAGNOSIS — I482 Chronic atrial fibrillation, unspecified: Secondary | ICD-10-CM | POA: Diagnosis present

## 2018-06-17 DIAGNOSIS — Z7902 Long term (current) use of antithrombotics/antiplatelets: Secondary | ICD-10-CM | POA: Diagnosis not present

## 2018-06-17 DIAGNOSIS — D649 Anemia, unspecified: Secondary | ICD-10-CM | POA: Diagnosis not present

## 2018-06-17 DIAGNOSIS — R0789 Other chest pain: Secondary | ICD-10-CM | POA: Diagnosis not present

## 2018-06-17 DIAGNOSIS — Z888 Allergy status to other drugs, medicaments and biological substances status: Secondary | ICD-10-CM | POA: Diagnosis not present

## 2018-06-17 DIAGNOSIS — Z79899 Other long term (current) drug therapy: Secondary | ICD-10-CM | POA: Diagnosis not present

## 2018-06-17 DIAGNOSIS — R0602 Shortness of breath: Secondary | ICD-10-CM | POA: Diagnosis not present

## 2018-06-17 DIAGNOSIS — E871 Hypo-osmolality and hyponatremia: Secondary | ICD-10-CM | POA: Diagnosis present

## 2018-06-17 DIAGNOSIS — I2510 Atherosclerotic heart disease of native coronary artery without angina pectoris: Secondary | ICD-10-CM | POA: Diagnosis not present

## 2018-06-17 DIAGNOSIS — R4020 Unspecified coma: Secondary | ICD-10-CM | POA: Diagnosis not present

## 2018-06-17 DIAGNOSIS — R079 Chest pain, unspecified: Secondary | ICD-10-CM | POA: Diagnosis not present

## 2018-06-17 DIAGNOSIS — Z436 Encounter for attention to other artificial openings of urinary tract: Secondary | ICD-10-CM | POA: Diagnosis not present

## 2018-06-17 DIAGNOSIS — J181 Lobar pneumonia, unspecified organism: Secondary | ICD-10-CM | POA: Diagnosis not present

## 2018-06-17 DIAGNOSIS — E43 Unspecified severe protein-calorie malnutrition: Secondary | ICD-10-CM | POA: Diagnosis present

## 2018-06-17 DIAGNOSIS — N184 Chronic kidney disease, stage 4 (severe): Secondary | ICD-10-CM | POA: Diagnosis not present

## 2018-06-17 DIAGNOSIS — R0902 Hypoxemia: Secondary | ICD-10-CM | POA: Diagnosis not present

## 2018-06-17 DIAGNOSIS — Z66 Do not resuscitate: Secondary | ICD-10-CM | POA: Diagnosis present

## 2018-06-17 DIAGNOSIS — Z91041 Radiographic dye allergy status: Secondary | ICD-10-CM | POA: Diagnosis not present

## 2018-06-17 DIAGNOSIS — Z955 Presence of coronary angioplasty implant and graft: Secondary | ICD-10-CM | POA: Diagnosis not present

## 2018-06-17 DIAGNOSIS — J9690 Respiratory failure, unspecified, unspecified whether with hypoxia or hypercapnia: Secondary | ICD-10-CM | POA: Diagnosis not present

## 2018-06-17 DIAGNOSIS — J9620 Acute and chronic respiratory failure, unspecified whether with hypoxia or hypercapnia: Secondary | ICD-10-CM | POA: Diagnosis not present

## 2018-06-17 DIAGNOSIS — J869 Pyothorax without fistula: Secondary | ICD-10-CM | POA: Diagnosis present

## 2018-06-17 DIAGNOSIS — I509 Heart failure, unspecified: Secondary | ICD-10-CM | POA: Diagnosis present

## 2018-06-17 DIAGNOSIS — I340 Nonrheumatic mitral (valve) insufficiency: Secondary | ICD-10-CM | POA: Diagnosis not present

## 2018-06-17 DIAGNOSIS — I4891 Unspecified atrial fibrillation: Secondary | ICD-10-CM | POA: Diagnosis not present

## 2018-06-17 DIAGNOSIS — I5032 Chronic diastolic (congestive) heart failure: Secondary | ICD-10-CM | POA: Diagnosis not present

## 2018-06-17 DIAGNOSIS — C3491 Malignant neoplasm of unspecified part of right bronchus or lung: Secondary | ICD-10-CM | POA: Diagnosis not present

## 2018-06-17 DIAGNOSIS — Z7401 Bed confinement status: Secondary | ICD-10-CM | POA: Diagnosis not present

## 2018-06-17 DIAGNOSIS — Z9981 Dependence on supplemental oxygen: Secondary | ICD-10-CM | POA: Diagnosis not present

## 2018-06-17 DIAGNOSIS — J449 Chronic obstructive pulmonary disease, unspecified: Secondary | ICD-10-CM | POA: Diagnosis present

## 2018-06-17 DIAGNOSIS — Z87891 Personal history of nicotine dependence: Secondary | ICD-10-CM | POA: Diagnosis not present

## 2018-06-17 DIAGNOSIS — M2550 Pain in unspecified joint: Secondary | ICD-10-CM | POA: Diagnosis not present

## 2018-06-17 DIAGNOSIS — I252 Old myocardial infarction: Secondary | ICD-10-CM | POA: Diagnosis not present

## 2018-06-17 DIAGNOSIS — I130 Hypertensive heart and chronic kidney disease with heart failure and stage 1 through stage 4 chronic kidney disease, or unspecified chronic kidney disease: Secondary | ICD-10-CM | POA: Diagnosis not present

## 2018-06-17 DIAGNOSIS — R05 Cough: Secondary | ICD-10-CM | POA: Diagnosis not present

## 2018-06-17 DIAGNOSIS — I361 Nonrheumatic tricuspid (valve) insufficiency: Secondary | ICD-10-CM | POA: Diagnosis not present

## 2018-06-17 DIAGNOSIS — Z6823 Body mass index (BMI) 23.0-23.9, adult: Secondary | ICD-10-CM | POA: Diagnosis not present

## 2018-06-17 DIAGNOSIS — M1990 Unspecified osteoarthritis, unspecified site: Secondary | ICD-10-CM | POA: Diagnosis present

## 2018-06-17 DIAGNOSIS — J90 Pleural effusion, not elsewhere classified: Secondary | ICD-10-CM | POA: Diagnosis not present

## 2018-06-17 DIAGNOSIS — R404 Transient alteration of awareness: Secondary | ICD-10-CM | POA: Diagnosis not present

## 2018-06-17 DIAGNOSIS — J189 Pneumonia, unspecified organism: Secondary | ICD-10-CM | POA: Diagnosis not present

## 2018-06-17 DIAGNOSIS — J9601 Acute respiratory failure with hypoxia: Secondary | ICD-10-CM | POA: Diagnosis not present

## 2018-06-17 DIAGNOSIS — R0600 Dyspnea, unspecified: Secondary | ICD-10-CM | POA: Diagnosis not present

## 2018-06-19 ENCOUNTER — Ambulatory Visit: Payer: Medicare Other | Admitting: Sports Medicine

## 2018-06-25 DIAGNOSIS — R0600 Dyspnea, unspecified: Secondary | ICD-10-CM | POA: Diagnosis not present

## 2018-06-25 DIAGNOSIS — I482 Chronic atrial fibrillation, unspecified: Secondary | ICD-10-CM | POA: Diagnosis not present

## 2018-06-25 DIAGNOSIS — J90 Pleural effusion, not elsewhere classified: Secondary | ICD-10-CM | POA: Diagnosis not present

## 2018-06-25 DIAGNOSIS — J449 Chronic obstructive pulmonary disease, unspecified: Secondary | ICD-10-CM | POA: Diagnosis not present

## 2018-06-25 DIAGNOSIS — N184 Chronic kidney disease, stage 4 (severe): Secondary | ICD-10-CM | POA: Diagnosis not present

## 2018-07-19 DEATH — deceased
